# Patient Record
Sex: Female | Born: 2000
Health system: Southern US, Community
[De-identification: ages and names within clinical notes are randomized; demographics above are authoritative.]

## PROBLEM LIST (undated history)

## (undated) DIAGNOSIS — F509 Eating disorder, unspecified: Secondary | ICD-10-CM

## (undated) DIAGNOSIS — J45990 Exercise induced bronchospasm: Secondary | ICD-10-CM

## (undated) DIAGNOSIS — R109 Unspecified abdominal pain: Secondary | ICD-10-CM

## (undated) DIAGNOSIS — T7840XA Allergy, unspecified, initial encounter: Secondary | ICD-10-CM

## (undated) DIAGNOSIS — J45909 Unspecified asthma, uncomplicated: Secondary | ICD-10-CM

## (undated) DIAGNOSIS — R1110 Vomiting, unspecified: Secondary | ICD-10-CM

## (undated) HISTORY — DX: Unspecified abdominal pain: R10.9

## (undated) HISTORY — DX: Vomiting, unspecified: R11.10

## (undated) HISTORY — PX: DISTAL PATELLAR REALIGNMENT: SHX1469

## (undated) HISTORY — DX: Unspecified asthma, uncomplicated: J45.909

## (undated) HISTORY — PX: ADENOIDECTOMY: SHX5191

## (undated) HISTORY — DX: Allergy, unspecified, initial encounter: T78.40XA

---
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
## 2020-01-08 DIAGNOSIS — S50311A Abrasion of right elbow, initial encounter: Secondary | ICD-10-CM | POA: Diagnosis not present

## 2020-02-26 DIAGNOSIS — M25551 Pain in right hip: Secondary | ICD-10-CM | POA: Diagnosis not present

## 2020-02-26 DIAGNOSIS — S76011A Strain of muscle, fascia and tendon of right hip, initial encounter: Secondary | ICD-10-CM | POA: Diagnosis not present

## 2020-03-22 DIAGNOSIS — R269 Unspecified abnormalities of gait and mobility: Secondary | ICD-10-CM | POA: Diagnosis not present

## 2020-03-22 DIAGNOSIS — M25551 Pain in right hip: Secondary | ICD-10-CM | POA: Diagnosis not present

## 2020-03-22 DIAGNOSIS — M25552 Pain in left hip: Secondary | ICD-10-CM | POA: Diagnosis not present

## 2020-03-29 ENCOUNTER — Telehealth: Payer: 59

## 2020-03-29 DIAGNOSIS — M25551 Pain in right hip: Secondary | ICD-10-CM | POA: Diagnosis not present

## 2020-03-29 DIAGNOSIS — S76011D Strain of muscle, fascia and tendon of right hip, subsequent encounter: Secondary | ICD-10-CM | POA: Diagnosis not present

## 2020-04-06 ENCOUNTER — Encounter: Payer: Self-pay | Admitting: Family

## 2020-04-06 ENCOUNTER — Telehealth (INDEPENDENT_AMBULATORY_CARE_PROVIDER_SITE_OTHER): Payer: Self-pay | Admitting: Family

## 2020-04-06 DIAGNOSIS — F4323 Adjustment disorder with mixed anxiety and depressed mood: Secondary | ICD-10-CM

## 2020-04-06 MED ORDER — ESCITALOPRAM OXALATE 10 MG PO TABS: 10.0000 mg | ORAL_TABLET | Freq: Every day | ORAL | 0 refills | Status: DC

## 2020-04-06 MED FILL — ESCITALOPRAM 10 MG TABLET: 10 | 30 days supply | Qty: 30 | Fill #0

## 2020-04-06 NOTE — Progress Notes (Signed)
This note is not being shared with the patient for the following reason: To respect privacy (The patient or proxy has requested that the information not be shared).   THIS RECORD MAY CONTAIN CONFIDENTIAL INFORMATION THAT SHOULD NOT BE RELEASED WITHOUT REVIEW OF THE SERVICE PROVIDER.  Virtual Follow-Up Visit via Video Note  I connected with Bridget [name]   on 04/06/20 at 10:30 AM EDT by a video enabled telemedicine application and verified that I am speaking with the correct person using two identifiers.   [place]    I discussed the limitations of evaluation and management by telemedicine and the availability of in person appointments.  I discussed that the purpose of this telehealth visit is to provide medical care while limiting exposure to the novel coronavirus.  The patient expressed understanding and agreed to proceed.   Bridget [name] is a 19 y.o. female referred by Marcelina Morel, MD here today for follow-up of adjustment disorder with mixed anxiety and depressed mood.   History was provided by the patient.  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  HENT: Negative for sore throat.   Eyes: Negative for blurred vision and pain.  Cardiovascular: Negative for chest pain and palpitations.  Gastrointestinal: Negative for abdominal pain.  Musculoskeletal: Negative for myalgias.  Skin: Negative for rash.  Neurological: Negative for dizziness and headaches.  Psychiatric/Behavioral: Negative for depression. The patient is nervous/anxious.      Allergies  Allergen Reactions  . Food Anaphylaxis    Tree nuts  . Peanut-Containing Drug Products Anaphylaxis  . Amoxicillin Hives  . Cephalosporins Other (See Comments)    omnicef--erthyema multiforme - unknown allergic reaction per mom  . Dust Mite Extract   . Lactose Intolerance (Gi) Other (See Comments)    Gas and bloating - reaction to whey, casein, milk products  . Milk-Related Compounds Other (See Comments)    Gas and bloating, rash   . Mold Extract [Trichophyton]   . Penicillins Hives    Has patient had a PCN reaction causing immediate rash, facial/tongue/throat swelling, SOB or lightheadedness with hypotension: Yes Has patient had a PCN reaction causing severe rash involving mucus membranes or skin necrosis: No Has patient had a PCN reaction that required hospitalization No Has patient had a PCN reaction occurring within the last 10 years: No If all of the above answers are "NO", then may proceed with Cephalosporin use.   Outpatient Medications Prior to Visit  Medication Sig Dispense Refill  . Albuterol Sulfate (PROAIR RESPICLICK) 947 (90 Base) MCG/ACT AEPB Inhale 2 puffs into the lungs  See admin instructions. Reported on 02/28/2016    . budesonide (PULMICORT) 180 MCG/ACT inhaler Use 2 puffs once daily to prevent cough or wheeze. 1 Inhaler 4  . EPINEPHrine (EPIPEN JR 2-PAK) 0.15 MG/0.3ML injection Inject 0.15 mg into the muscle once as needed  for anaphylaxis (nut allergies). Reported on 03/29/2016    . EPINEPHrine 0.3 mg/0.3 mL IJ SOAJ injection Inject 0.3 mg into the muscle once.    Marland Kitchen FLUoxetine (PROZAC) 20 MG capsule TAKE 1 CAPSULE (20 MG TOTAL) BY MOUTH DAILY. TAKE WITH 40MG  CAPSULE FOR TOTAL OF 60 MG 90 capsule 1  . FLUoxetine (PROZAC) 40 MG capsule TAKE 1 CAPSULE (40 MG TOTAL) BY MOUTH DAILY. TAKE WITH 20MG  CAPSULE FOR TOTAL OF 60MG  90 capsule 1  . ibuprofen (ADVIL,MOTRIN) 200 MG tablet Take 200 mg by mouth daily as needed (pain).    Marland Kitchen loratadine (CLARITIN REDITABS) 10 MG dissolvable tablet Take 10 mg by mouth daily.    . montelukast (SINGULAIR) 10 MG tablet Take one tablet each evening to prevent cough or wheeze. 30 tablet 5  . Pediatric Multiple Vit-C-FA (MULTIVITAMIN ANIMAL SHAPES, WITH CA/FA,) with C & FA chewable tablet Chew 1 tablet by mouth daily.    . Probiotic Product (PROBIOTIC PO) Take by mouth.     No facility-administered medications prior to visit.     Patient Active Problem List   Diagnosis Date Noted  . Status post laparoscopic cholecystectomy 05/13/2018  . Adjustment disorder with mixed anxiety and depressed mood 03/27/2016  . Disordered eating 03/27/2016  . 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 Exercise-induced asthma 04/26/2015  . 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 Osteochondral lesion 12/23/2014   Visual Observations/Objective:   General Appearance: Well nourished well developed, in no apparent distress.  Eyes: conjunctiva no swelling or erythema ENT/Mouth: No hoarseness, No cough for duration of visit.  Neck: Supple  Respiratory: Respiratory effort normal, normal rate, no retractions or distress.   Cardio: Appears well-perfused, noncyanotic Musculoskeletal: no obvious deformity Skin: visible skin without rashes, ecchymosis, erythema Neuro: Awake and oriented X 3,  Psych:  normal affect, Insight and Judgment appropriate.    Assessment/Plan: 1. Adjustment disorder  with mixed anxiety and depressed mood -reviewed SSRI and role in anxiety -she would like to try new medication; discussed lexapro 10 mg  -will send to local pharmacy  -sent My Chart message to initiate set up -she will send message if she wants to start medication this week while at beach  -will send referal for therapist: Effie Shy    I discussed the assessment and treatment plan with the patient and/or parent/guardian.  They were provided an opportunity to ask questions and all were answered.  They agreed with the plan and demonstrated an understanding of the instructions. They were advised to call back or seek an in-person evaluation in the emergency room if the symptoms worsen or if the condition fails to improve as anticipated.   Follow-up:   2 weeks   Medical decision-making:   I spent 45 minutes on this telehealth visit inclusive of face-to-face video and care coordination time I was located remote during this encounter.   Parthenia Ames, NP    CC: Marcelina Morel, MD, Marcelina Morel, MD

## 2020-04-14 DIAGNOSIS — J3089 Other allergic rhinitis: Secondary | ICD-10-CM | POA: Diagnosis not present

## 2020-04-14 DIAGNOSIS — Z91010 Allergy to peanuts: Secondary | ICD-10-CM | POA: Diagnosis not present

## 2020-04-14 DIAGNOSIS — L2089 Other atopic dermatitis: Secondary | ICD-10-CM | POA: Diagnosis not present

## 2020-04-14 DIAGNOSIS — J301 Allergic rhinitis due to pollen: Secondary | ICD-10-CM | POA: Diagnosis not present

## 2020-04-14 MED FILL — SYMBICORT 80-4.5 MCG INH: 80-4.5 | 15 days supply | Qty: 10 | Fill #0

## 2020-04-19 DIAGNOSIS — R269 Unspecified abnormalities of gait and mobility: Secondary | ICD-10-CM | POA: Diagnosis not present

## 2020-04-19 DIAGNOSIS — M25552 Pain in left hip: Secondary | ICD-10-CM | POA: Diagnosis not present

## 2020-04-19 DIAGNOSIS — M25551 Pain in right hip: Secondary | ICD-10-CM | POA: Diagnosis not present

## 2020-04-20 ENCOUNTER — Encounter: Payer: Self-pay | Admitting: Family

## 2020-04-20 ENCOUNTER — Telehealth: Payer: Self-pay | Admitting: Family

## 2020-04-20 ENCOUNTER — Telehealth: Payer: Self-pay

## 2020-04-20 DIAGNOSIS — Z113 Encounter for screening for infections with a predominantly sexual mode of transmission: Secondary | ICD-10-CM | POA: Diagnosis not present

## 2020-04-20 DIAGNOSIS — F4323 Adjustment disorder with mixed anxiety and depressed mood: Secondary | ICD-10-CM

## 2020-04-20 DIAGNOSIS — Z01419 Encounter for gynecological examination (general) (routine) without abnormal findings: Secondary | ICD-10-CM | POA: Diagnosis not present

## 2020-04-20 MED FILL — ETONOGESTREL-ETHINYL ESTRAD: 0.12-0.015 | 84 days supply | Qty: 3 | Fill #0

## 2020-04-20 NOTE — Telephone Encounter (Signed)
Pt started Lexapro on 7/3. So far doing well. No adverse effects. Rescheduled appointment for next week for a virtual visit.

## 2020-04-20 NOTE — Progress Notes (Signed)
Patient not seen. Will call for reschedule.

## 2020-04-26 DIAGNOSIS — M25552 Pain in left hip: Secondary | ICD-10-CM | POA: Diagnosis not present

## 2020-04-26 DIAGNOSIS — M25551 Pain in right hip: Secondary | ICD-10-CM | POA: Diagnosis not present

## 2020-04-26 DIAGNOSIS — R269 Unspecified abnormalities of gait and mobility: Secondary | ICD-10-CM | POA: Diagnosis not present

## 2020-04-27 ENCOUNTER — Telehealth (INDEPENDENT_AMBULATORY_CARE_PROVIDER_SITE_OTHER): Payer: Self-pay | Admitting: Family

## 2020-04-27 DIAGNOSIS — Z7182 Exercise counseling: Secondary | ICD-10-CM | POA: Diagnosis not present

## 2020-04-27 DIAGNOSIS — Z111 Encounter for screening for respiratory tuberculosis: Secondary | ICD-10-CM | POA: Diagnosis not present

## 2020-04-27 DIAGNOSIS — Z113 Encounter for screening for infections with a predominantly sexual mode of transmission: Secondary | ICD-10-CM | POA: Diagnosis not present

## 2020-04-27 DIAGNOSIS — Z0000 Encounter for general adult medical examination without abnormal findings: Secondary | ICD-10-CM | POA: Diagnosis not present

## 2020-04-27 DIAGNOSIS — Z68.41 Body mass index (BMI) pediatric, 5th percentile to less than 85th percentile for age: Secondary | ICD-10-CM | POA: Diagnosis not present

## 2020-04-27 DIAGNOSIS — F4323 Adjustment disorder with mixed anxiety and depressed mood: Secondary | ICD-10-CM

## 2020-04-27 DIAGNOSIS — Z713 Dietary counseling and surveillance: Secondary | ICD-10-CM | POA: Diagnosis not present

## 2020-04-27 DIAGNOSIS — Z00129 Encounter for routine child health examination without abnormal findings: Secondary | ICD-10-CM | POA: Diagnosis not present

## 2020-04-27 MED ORDER — ESCITALOPRAM OXALATE 10 MG PO TABS: 10.0000 mg | ORAL_TABLET | Freq: Every day | ORAL | 0 refills | Status: DC

## 2020-04-27 NOTE — Progress Notes (Signed)
This note is not being shared with the patient for the following reason: To respect privacy (The patient or proxy has requested that the information not be shared).  THIS RECORD MAY CONTAIN CONFIDENTIAL INFORMATION THAT SHOULD NOT BE RELEASED WITHOUT REVIEW OF THE SERVICE PROVIDER.  Virtual Follow-Up Visit via Video Note  I connected with Bridget [name] 's patient  on 04/27/20 at 10:00 AM EDT by a video enabled telemedicine application and verified that I am speaking with the correct person using two identifiers.   Patient/parent location: home   I discussed the limitations of evaluation and management by telemedicine and the availability of in person appointments.  I discussed that the purpose of this telehealth visit is to provide medical care while limiting exposure to the novel coronavirus.  The patient expressed understanding and agreed to proceed.   Bridget [name] is a 19 y.o. female referred by Marcelina Morel, MD here today for follow-up of adjustment disorder with mixed anxiety and depressed mood   History was provided by the patient.  Plan from Last Visit:   -start lexapro 10 mg  -referral for therapy with Effie Shy   Chief Complaint: -Adjustment disorder with mixed anxiety and depressed mood  History of Present Illness:  -been feeling really good  -first day was really good, definitely has seen improvement overall  -parents have commented that she seems happier -anxiety is lower -takes it at night 7-8 pm, with food on her stomach to avoid acid reflux  -in the morning, has been slightly nauseous in the morning  -sleep has been pretty good   Review of Systems  Constitutional: Negative for malaise/fatigue and weight loss.  HENT: Negative for sore throat.   Eyes: Negative for blurred vision and pain.  Respiratory: Negative for shortness of breath.   Cardiovascular: Negative for chest pain and palpitations.  Gastrointestinal: Negative for abdominal pain and nausea.   Genitourinary: Negative for dysuria.  Musculoskeletal: Negative for joint pain and myalgias.  Skin: Negative for itching and rash.  Neurological: Negative for dizziness and headaches.  Psychiatric/Behavioral: Negative for depression and suicidal ideas. The patient is not nervous/anxious.      Allergies  Allergen Reactions  . Food Anaphylaxis    Tree nuts  . Peanut-Containing Drug Products Anaphylaxis  . Amoxicillin Hives  . Cephalosporins Other (See Comments)    omnicef--erthyema multiforme - unknown allergic reaction per mom  . Dust Mite Extract   . Lactose Intolerance (Gi) Other (See Comments)    Gas and bloating - reaction to whey, casein, milk products  . Milk-Related Compounds Other (See Comments)    Gas and bloating, rash   . Mold Extract [Trichophyton]   . Penicillins Hives    Has patient had a PCN reaction causing immediate rash, facial/tongue/throat swelling, SOB or lightheadedness with hypotension: Yes Has patient had a PCN reaction causing severe rash involving mucus membranes or skin necrosis: No Has patient had a PCN reaction that required hospitalization No Has patient had a PCN reaction occurring within the last 10 years: No If all of the above answers are "NO", then may proceed with Cephalosporin use.   Outpatient Medications Prior to Visit  Medication Sig Dispense Refill  . Albuterol Sulfate (PROAIR RESPICLICK) 564 (90 Base) MCG/ACT AEPB Inhale 2 puffs into the lungs See admin instructions. Reported on 02/28/2016    . budesonide (PULMICORT) 180 MCG/ACT inhaler Use 2 puffs once daily to prevent cough or wheeze. 1 Inhaler 4  . EPINEPHrine (EPIPEN JR 2-PAK) 0.15 MG/0.3ML injection  Inject 0.15 mg into the muscle once as needed for anaphylaxis (nut allergies). Reported on 03/29/2016    . EPINEPHrine 0.3 mg/0.3 mL IJ SOAJ injection Inject 0.3 mg into the muscle once.    . escitalopram (LEXAPRO) 10 MG tablet Take 1 tablet (10 mg total) by mouth daily. 30 tablet 0  .  ibuprofen (ADVIL,MOTRIN) 200 MG tablet Take 200 mg by mouth daily as needed (pain).    Marland Kitchen loratadine (CLARITIN REDITABS) 10 MG dissolvable tablet Take 10 mg by mouth daily.    . montelukast (SINGULAIR) 10 MG tablet Take one tablet each evening to prevent cough or wheeze. 30 tablet 5  . Pediatric Multiple Vit-C-FA (MULTIVITAMIN ANIMAL SHAPES, WITH CA/FA,) with C & FA chewable tablet Chew 1 tablet by mouth daily.    . Probiotic Product (PROBIOTIC PO) Take by mouth.     No facility-administered medications prior to visit.     Patient Active Problem List   Diagnosis Date Noted  . Status post laparoscopic cholecystectomy 05/13/2018  . Adjustment disorder with mixed anxiety and depressed mood 03/27/2016  . Disordered eating 03/27/2016  . 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 Exercise-induced asthma 04/26/2015  . 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 Osteochondral lesion 12/23/2014   The following portions of the patient's history were reviewed and updated as appropriate: allergies, current medications, past family history, past medical history, past social history, past surgical history and problem list.  Visual Observations/Objective:   General Appearance: Well nourished well developed, in no apparent distress.  Eyes: conjunctiva no swelling or erythema ENT/Mouth: No hoarseness, No cough for duration of visit.  Neck: Supple  Respiratory: Respiratory effort normal, normal rate, no retractions or distress.   Cardio: Appears well-perfused, noncyanotic Musculoskeletal: no obvious deformity Skin: visible skin without rashes, ecchymosis, erythema Neuro: Awake and oriented X 3,  Psych:  normal affect, Insight and Judgment appropriate.    Assessment/Plan: 1. Adjustment disorder with mixed anxiety and depressed mood  -improvement noted with onset  -continue to monitor symptoms  -Effie Shy for sports psychology therapy  -[place] screenings: PHQSADS at next VV  I  discussed the assessment and treatment plan with the patient and/or parent/guardian.  They were provided an opportunity to ask questions and all were answered.  They agreed with the plan and demonstrated an understanding of the instructions. They were advised to call back or seek an in-person evaluation in the emergency room if the symptoms worsen or if the condition fails to improve as anticipated.   Follow-up: 4 weeks   Medical decision-making:   I spent 15 minutes on this telehealth visit inclusive of face-to-face video and care coordination time I was located remote during this encounter.   Parthenia Ames, NP    CC: Marcelina Morel, MD, Marcelina Morel, MD

## 2020-04-28 ENCOUNTER — Encounter: Payer: Self-pay | Admitting: Family

## 2020-04-30 MED FILL — ESCITALOPRAM 10 MG TABLET: 10 | 90 days supply | Qty: 90 | Fill #0

## 2020-05-03 DIAGNOSIS — Z111 Encounter for screening for respiratory tuberculosis: Secondary | ICD-10-CM | POA: Diagnosis not present

## 2020-05-03 DIAGNOSIS — R269 Unspecified abnormalities of gait and mobility: Secondary | ICD-10-CM | POA: Diagnosis not present

## 2020-05-03 DIAGNOSIS — M25551 Pain in right hip: Secondary | ICD-10-CM | POA: Diagnosis not present

## 2020-05-03 DIAGNOSIS — M25552 Pain in left hip: Secondary | ICD-10-CM | POA: Diagnosis not present

## 2020-05-10 DIAGNOSIS — M25552 Pain in left hip: Secondary | ICD-10-CM | POA: Diagnosis not present

## 2020-05-10 DIAGNOSIS — M25551 Pain in right hip: Secondary | ICD-10-CM | POA: Diagnosis not present

## 2020-05-10 DIAGNOSIS — R269 Unspecified abnormalities of gait and mobility: Secondary | ICD-10-CM | POA: Diagnosis not present

## 2020-05-20 ENCOUNTER — Other Ambulatory Visit: Payer: Self-pay | Admitting: Family

## 2020-05-26 DIAGNOSIS — Z0000 Encounter for general adult medical examination without abnormal findings: Secondary | ICD-10-CM | POA: Diagnosis not present

## 2020-06-01 ENCOUNTER — Telehealth: Payer: Self-pay | Admitting: Family

## 2020-06-29 DIAGNOSIS — H00021 Hordeolum internum right upper eyelid: Secondary | ICD-10-CM | POA: Diagnosis not present

## 2020-08-20 DIAGNOSIS — Z23 Encounter for immunization: Secondary | ICD-10-CM | POA: Diagnosis not present

## 2020-08-20 DIAGNOSIS — S91342A Puncture wound with foreign body, left foot, initial encounter: Secondary | ICD-10-CM | POA: Diagnosis not present

## 2020-08-22 DIAGNOSIS — J039 Acute tonsillitis, unspecified: Secondary | ICD-10-CM | POA: Diagnosis not present

## 2020-08-22 DIAGNOSIS — B009 Herpesviral infection, unspecified: Secondary | ICD-10-CM | POA: Diagnosis not present

## 2020-10-06 ENCOUNTER — Encounter: Payer: Self-pay | Admitting: Family Medicine

## 2020-10-06 ENCOUNTER — Other Ambulatory Visit: Payer: Self-pay | Admitting: Family Medicine

## 2020-10-06 ENCOUNTER — Ambulatory Visit: Payer: 59 | Admitting: Family Medicine

## 2020-10-06 VITALS — BP 116/70 | HR 72 | Ht 60.0 in | Wt 120.8 lb

## 2020-10-06 DIAGNOSIS — J45990 Exercise induced bronchospasm: Secondary | ICD-10-CM | POA: Diagnosis not present

## 2020-10-06 DIAGNOSIS — J309 Allergic rhinitis, unspecified: Secondary | ICD-10-CM | POA: Diagnosis not present

## 2020-10-06 DIAGNOSIS — N926 Irregular menstruation, unspecified: Secondary | ICD-10-CM | POA: Diagnosis not present

## 2020-10-06 DIAGNOSIS — F4323 Adjustment disorder with mixed anxiety and depressed mood: Secondary | ICD-10-CM | POA: Diagnosis not present

## 2020-10-06 DIAGNOSIS — E559 Vitamin D deficiency, unspecified: Secondary | ICD-10-CM

## 2020-10-06 DIAGNOSIS — R5383 Other fatigue: Secondary | ICD-10-CM

## 2020-10-06 MED ORDER — BUDESONIDE 180 MCG/ACT IN AEPB: INHALATION_SPRAY | RESPIRATORY_TRACT | 0 refills | Status: DC

## 2020-10-06 MED ORDER — MONTELUKAST SODIUM 10 MG PO TABS: ORAL_TABLET | ORAL | 0 refills | Status: DC

## 2020-10-06 MED FILL — MONTELUKAST SOD 10 MG TAB: 10 | 30 days supply | Qty: 30 | Fill #0

## 2020-10-06 NOTE — Patient Instructions (Addendum)
Restart the montelukast, pulmicort and flonase. Continue the zyrtec. You have evidence of allergies (nasal, which is likely draining into the throat/chest, which is contributing to some asthma).  Drink plenty of water (remember very pale/clear urine is the goal). Be sure to adequately fuel your workouts.  You likely have a little bit of reflux/GERD. Use prilosec OTC once daily for up to 2 weeks or Pepcid 20mg  twice daily.  This will treat reflux, should help with any stomach pain, and also with the nausea.

## 2020-10-06 NOTE — Progress Notes (Signed)
Chief Complaint  Patient presents with  . Establish Care    Establish care. Also has not felt well in over a month. Works out over [employer] a day. Very fatigued, dizzy at times. Things that used to never tire her out now do. Has body aches and some hot flashes, sweats. Had gallbladder out 2 years ago.   Patient presents to establish care, with complaints of fatigue, dizziness, nausea, hot flashes and muscle aches.  She is a Museum/gallery exhibitions officer at [employer], and is on the triathlon team. She reports she had intense training up through November. Season is over now. After Nationals 11/14, trained only a little. Ever since then she has had persistent/worsening nausea, feels overly fatigued.  Doesn't feel refreshed when she wakes up. But also not sleeping well. Recently she has been waking up at night with sweats..  Not sleeping as well at home as she does at school.  She has some trouble falling asleep. Also, at home she has cats, having some flare of allergies. Nausea is also worse at night. Eats dinner 7-8pm.  Bedtime 10pm. Feels nauseated as she tries to fall asleep, and wakes up with it. Denies heartburn. Does have h/o reflux when younger.  Carbonated beverages also cause discomfort.  She has some mild diffuse abdominal discomfort, and has been having some frequent stools (not loose).  She developed a dry, sore throat after Nationals in [place].  She reports she was evaluated, had negative strep and other tests.  Also treated for a cold sore. Those symptoms resolved.  She wonders if potentially she could have had mono.  She is complaining of body aches, which she reports feels different from soreness from workout.  She feels like her body isn't recovering. Woke up today with her back in a "knot".  Because of this she is having some discomfort breathing today.   Ran/biked yesterday--seemed harder than usual. Mother notes that she exercises back to back at home, while at school her training was broken up by  classes, meals.  She used to run 26 miles/week at school, and now is struggling with a 4 mile run.  Feels nauseated and dizzy when swimming, and also hits her when she stands up.  During finals week she ate some sugar before/after working out,which helped Doesn't usually use electrolyte supplements for training. Tries to eat a lot of protein, not taking protein supplements due to allergies.  Patient has a history of asthma and allergies.  She used to take pulmicort, montelukast, flonase and zyrtec.  She wasn't having any flares of asthma since starting school, so stopped taking montelukast and pulmicort.  She continues to take zyrtec daily.  She did have an asthma flare the other day--noted more chest discomfort when exercising. Feels like she can't breathe as well with exercise, didn't feel the same as wheezing, didn't use inhaler. Today having some trouble breathing, possibly related to back pain she woke up with.  Menses are very irregular. Last one was very heavy, 12/9 She saw Dr. Philis Pique over the summer, was prescribed NuvaRing, but never started it. She is in a sexual relationship, 1 partner, uses condoms.  She took a home pregnancy test last week, which was negative. COVID test negative 2 weeks ago (when she returned from school)  Lexapro started in HS, stopped in school.  This history was a little vague/confusing (patient allowed mother to stay, who hadn't been aware that she had stopped medication).  She denies any depression or anxiety.  Past Medical History:  Diagnosis Date  . Abdominal pain   . Allergy    Phreesia 04/04/2020  . Asthma    Phreesia 04/04/2020  . Eating disorder    9th-10th grade  . Exercise-induced asthma   . Vomiting    Past Surgical History:  Procedure Laterality Date  . ADENOIDECTOMY    . DISTAL PATELLAR REALIGNMENT Left   . LAPAROSCOPIC CHOLECYSTECTOMY  2019   Family History  Problem Relation Age of Onset  . Diabetes Brother   . Cancer Other    . Hypertension Other   . Asthma Other   . Depression Mother   . Depression Sister   . Anxiety disorder Sister   . Breast cancer Maternal Grandmother   . Lung cancer Paternal Grandfather        former smoker  . Hypertension Paternal Grandfather    Social History   Social History Narrative   Freshman at [employer].   Triathlete   Studying exercise science   No tobacco, alcohol, drugs, vaping. Condoms for contraception.  Meds: only taking zyrtec and probiotic currently. Outpatient Encounter Medications as of 10/06/2020  Medication Sig Note  . cetirizine (ZYRTEC) 10 MG chewable tablet Chew 10 mg by mouth daily.   . Probiotic Product (PROBIOTIC PO) Take by mouth. 10/06/2020: Drink   . Albuterol Sulfate (PROAIR RESPICLICK) 163 (90 Base) MCG/ACT AEPB Inhale 2 puffs into the lungs See admin instructions. Reported on 02/28/2016 (Patient not taking: Reported on 10/06/2020)   . budesonide (PULMICORT) 180 MCG/ACT inhaler Use 2 puffs once daily to prevent cough or wheeze. (Patient not taking: Reported on 10/06/2020)   . EPINEPHrine 0.3 mg/0.3 mL IJ SOAJ injection Inject 0.3 mg into the muscle once. (Patient not taking: Reported on 10/06/2020)   . escitalopram (LEXAPRO) 10 MG tablet TAKE 1 TABLET BY MOUTH ONCE DAILY (Patient not taking: Reported on 10/06/2020)   . ibuprofen (ADVIL,MOTRIN) 200 MG tablet Take 200 mg by mouth daily as needed (pain). (Patient not taking: Reported on 10/06/2020)   . montelukast (SINGULAIR) 10 MG tablet Take one tablet each evening to prevent cough or wheeze. (Patient not taking: Reported on 10/06/2020)   . [DISCONTINUED] EPINEPHrine (EPIPEN JR) 0.15 MG/0.3ML injection Inject 0.15 mg into the muscle once as needed for anaphylaxis (nut allergies). Reported on 03/29/2016 (Patient not taking: Reported on 10/06/2020) 8/46/6599: States duplicate.  . [DISCONTINUED] loratadine (CLARITIN REDITABS) 10 MG dissolvable tablet Take 10 mg by mouth daily.   . [DISCONTINUED] Pediatric  Multiple Vit-C-FA (MULTIVITAMIN ANIMAL SHAPES, WITH CA/FA,) with C & FA chewable tablet Chew 1 tablet by mouth daily.    No facility-administered encounter medications on file as of 10/06/2020.   Allergies  Allergen Reactions  . Food Anaphylaxis    Tree nuts  . Peanut-Containing Drug Products Anaphylaxis  . Amoxicillin Hives  . Cephalosporins Other (See Comments)    omnicef--erthyema multiforme - unknown allergic reaction per mom  . Dust Mite Extract   . Lactose Intolerance (Gi) Other (See Comments)    Gas and bloating - reaction to whey, casein, milk products  . Milk-Related Compounds Other (See Comments)    Gas and bloating, rash   . Mold Extract [Trichophyton]   . Penicillins Hives    Has patient had a PCN reaction causing immediate rash, facial/tongue/throat swelling, SOB or lightheadedness with hypotension: Yes Has patient had a PCN reaction causing severe rash involving mucus membranes or skin necrosis: No Has patient had a PCN reaction that required hospitalization No Has patient had a PCN reaction occurring within  the last 10 years: No If all of the above answers are "NO", then may proceed with Cephalosporin use.   ROS: no fever, chills, no current URI symptoms.  +dizziness, fatigue, nausea per HPI.  +back pain today. No change to hair, nails, skin. No rashes. Feels hotter than usual, some sweats at night. More frequent bowel movements. No urinary complaints.    PHYSICAL EXAM:  BP 116/70   Pulse 72   Ht 5' (1.524 m)   Wt 120 lb 12.8 oz (54.8 kg)   BMI 23.59 kg/m   Well developed female, in no distress She is alert, oriented. HEENT: conjunctiva and sclera are clear, EOMI.  Nasal mucosa is moderately edematous with clear mucus R>L. OP is clear. Sinuses nontender Neck: no lymphadenopathy, thyromegaly or mass Heart: regular rate and rhythm, no murmur Lungs: clear bilaterally, no wheezes Abdomen: mildly tender in epigastrium. Abdomen is soft, no masses.  Also  slightly tender diffusely bilaterally. No suprapubic tenderness. Back: no spinal tenderness, SI tenderness or CVA tenderness. She is tender inferomedially to R scapula, over paraspinous muscles. Extremities: no edema Neuro: alert and oriented, DTR's 2+, normal gait, strength Psych: normal mood, affect, hygiene and grooming Skin: no visible rashes, normal turgor  PF 250, 240  ASSESSMENT/PLAN:  Fatigue, unspecified type - Ddx reviewed, including related to poor sleep, allergies.  r/o other causes - Plan: Comprehensive metabolic panel, CBC with Differential/Platelet, VITAMIN D 25 Hydroxy (Vit-D Deficiency, Fractures), TSH  Irregular menses - Plan: TSH  Exercise-induced asthma - low PF, possibly exacerbated by allergies (including being home with cat), and med noncompliance. Restart inhaled steroids, singulair - Plan: budesonide (PULMICORT) 180 MCG/ACT inhaler, montelukast (SINGULAIR) 10 MG tablet  Allergic rhinitis, unspecified seasonality, unspecified trigger - cont zyrtec; restart flonase, singulair. May be contributing to her fatigue - Plan: montelukast (SINGULAIR) 10 MG tablet  Adjustment disorder with mixed anxiety and depressed mood - Stopped meds about 3 mos ago, denies recurrent depression/anxiety.  CBC, TSH, c-met, Vit D F/u 1-2 weeks, prior to going back to school.  COVID booster encouraged   Restart the montelukast, pulmicort and flonase. Continue the zyrtec. You have evidence of allergies (nasal, which is likely draining into the throat/chest, which is contributing to some asthma).  Drink plenty of water (remember very pale/clear urine is the goal). Be sure to adequately fuel your workouts.  You likely have a little bit of reflux/GERD. Use prilosec OTC once daily for up to 2 weeks or Pepcid 70m twice daily.  This will treat reflux, should help with any stomach pain, and also with the nausea.  I spent 55 minutes dedicated to the care of this patient, including pre-visit  review of records, face to face time, post-visit ordering of testing and documentation.

## 2020-10-07 ENCOUNTER — Other Ambulatory Visit: Payer: Self-pay | Admitting: Family Medicine

## 2020-10-07 LAB — COMPREHENSIVE METABOLIC PANEL
ALT: 17 IU/L (ref 0–32)
AST: 18 IU/L (ref 0–40)
Albumin/Globulin Ratio: 2.1 (ref 1.2–2.2)
Albumin: 4.7 g/dL (ref 3.9–5.0)
Alkaline Phosphatase: 61 IU/L (ref 42–106)
BUN/Creatinine Ratio: 29 — ABNORMAL HIGH (ref 9–23)
BUN: 18 mg/dL (ref 6–20)
Bilirubin Total: 1.2 mg/dL (ref 0.0–1.2)
CO2: 22 mmol/L (ref 20–29)
Calcium: 9.6 mg/dL (ref 8.7–10.2)
Chloride: 102 mmol/L (ref 96–106)
Creatinine, Ser: 0.62 mg/dL (ref 0.57–1.00)
GFR calc Af Amer: 151 mL/min/{1.73_m2} (ref >59)
GFR calc non Af Amer: 131 mL/min/{1.73_m2} (ref >59)
Globulin, Total: 2.2 g/dL (ref 1.5–4.5)
Glucose: 88 mg/dL (ref 65–99)
Potassium: 4.5 mmol/L (ref 3.5–5.2)
Sodium: 140 mmol/L (ref 134–144)
Total Protein: 6.9 g/dL (ref 6.0–8.5)

## 2020-10-07 LAB — CBC WITH DIFFERENTIAL/PLATELET
Basophils Absolute: 0 10*3/uL (ref 0.0–0.2)
Basos: 0 %
EOS (ABSOLUTE): 0.2 10*3/uL (ref 0.0–0.4)
Eos: 4 %
Hematocrit: 38.2 % (ref 34.0–46.6)
Hemoglobin: 13.2 g/dL (ref 11.1–15.9)
Immature Grans (Abs): 0 10*3/uL (ref 0.0–0.1)
Immature Granulocytes: 0 %
Lymphocytes Absolute: 2 10*3/uL (ref 0.7–3.1)
Lymphs: 35 %
MCH: 31.2 pg (ref 26.6–33.0)
MCHC: 34.6 g/dL (ref 31.5–35.7)
MCV: 90 fL (ref 79–97)
Monocytes Absolute: 0.7 10*3/uL (ref 0.1–0.9)
Monocytes: 12 %
Neutrophils Absolute: 2.8 10*3/uL (ref 1.4–7.0)
Neutrophils: 49 %
Platelets: 257 10*3/uL (ref 150–450)
RBC: 4.23 x10E6/uL (ref 3.77–5.28)
RDW: 12.5 % (ref 11.7–15.4)
WBC: 5.8 10*3/uL (ref 3.4–10.8)

## 2020-10-07 LAB — TSH: TSH: 1.19 u[IU]/mL (ref 0.450–4.500)

## 2020-10-07 LAB — VITAMIN D 25 HYDROXY (VIT D DEFICIENCY, FRACTURES): Vit D, 25-Hydroxy: 25.4 ng/mL — ABNORMAL LOW (ref 30.0–100.0)

## 2020-10-07 MED ORDER — VITAMIN D (ERGOCALCIFEROL) 1.25 MG (50000 UNIT) PO CAPS: 50000.0000 [IU] | ORAL_CAPSULE | ORAL | 0 refills | Status: DC

## 2020-10-07 MED FILL — VIT D2 1.25 MG (50,000 UNIT: 1.25 MG | 56 days supply | Qty: 8 | Fill #0

## 2020-10-07 NOTE — Addendum Note (Signed)
Addended by: Joselyn Arrow on: 10/07/2020 08:18 AM   Modules accepted: Orders

## 2020-10-13 ENCOUNTER — Telehealth: Payer: Self-pay

## 2020-10-13 NOTE — Telephone Encounter (Signed)
P.A. PULMICORT

## 2020-10-14 NOTE — Telephone Encounter (Signed)
P.A. denied, pt needs trial of Arnuity Ellipta and Flovent Diskus.  Do you want to switch?

## 2020-10-14 NOTE — Telephone Encounter (Signed)
Please contact patient and see if she has used either, if she has a preference. Arnuity would be my preference as it is just once daily.

## 2020-10-16 NOTE — Progress Notes (Deleted)
  Patient presents for follow-up.  She was seen 2 weeks ago to establish care with complaint of fatigue, dizziness, nausea, hot flashes and muscle aches.  At her visit, peak flow was noted to be low. Exam was also notable for significant nasal mucosal edema c/w allergies.  Patient has a history of asthma and allergies. She had been taking zyrtec daily, but hadn't been taking flonase, pulmicort or montelukast. She was asked to restart these medications. New prescription was sent for pulmicort, but wasn't covered--pt needs trial of Arnuity Ellipta and Flovent Diskus.   She had some epigastric discomfort, and it was felt that GERD might be contributing to her nausea.  She was advised to use prilosec OTC once daily for up to 2 weeks or Pepcid 20mg  twice daily.  UPDATE  She had labs done at last visit, got results via MyChart.  She was prescribed Vitamin D 50,000 IU weekly for low D level.  BUN/Cr ratio was elevated, otherwise labs were normal. She was encouraged to drink more water.  Vitamin D-OH 25.4  Lab Results  Component Value Date   WBC 5.8 10/06/2020   HGB 13.2 10/06/2020   HCT 38.2 10/06/2020   MCV 90 10/06/2020   PLT 257 10/06/2020     Chemistry      Component Value Date/Time   NA 140 10/06/2020 1515   K 4.5 10/06/2020 1515   CL 102 10/06/2020 1515   CO2 22 10/06/2020 1515   BUN 18 10/06/2020 1515   CREATININE 0.62 10/06/2020 1515   CREATININE 0.58 03/26/2016 1048      Component Value Date/Time   CALCIUM 9.6 10/06/2020 1515   ALKPHOS 61 10/06/2020 1515   AST 18 10/06/2020 1515   ALT 17 10/06/2020 1515   BILITOT 1.2 10/06/2020 1515     Lab Results  Component Value Date   TSH 1.190 10/06/2020    PMH, PSH, SH reviewed   PHYSICAL EXAM:  Wt Readings from Last 3 Encounters:  10/06/20 120 lb 12.8 oz (54.8 kg) (39 %, Z= -0.29)*  05/10/18 102 lb 4.7 oz (46.4 kg) (12 %, Z= -1.17)*  01/28/18 106 lb 9.6 oz (48.4 kg) (22 %, Z= -0.78)*   * Growth percentiles are based on  CDC (Girls, 2-20 Years) data.    Well developed female, in no distress She is alert, oriented. HEENT: conjunctiva and sclera are clear, EOMI.  Neck: no lymphadenopathy, thyromegaly or mass Heart: regular rate and rhythm, no murmur Lungs: clear bilaterally, no wheezes Abdomen: mildly tender in epigastrium. Abdomen is soft, no masses.  Also slightly tender diffusely bilaterally. No suprapubic tenderness. Back: no spinal tenderness, SI tenderness or CVA tenderness. She is tender inferomedially to R scapula, over paraspinous muscles. Extremities: no edema Neuro: alert and oriented, normal gait Psych: normal mood, affect, hygiene and grooming Skin: no visible rashes, normal turgor  UPDATE BACK, ABDOMEN  ASSESSMENT/PLAN:   P.A.for pulmicort denied, pt needs trial of Arnuity Ellipta and Flovent Diskus.  01/30/18 was asked to contact patient to see if she had a preference, had taken either.  No documentation that pt was called back.

## 2020-10-17 ENCOUNTER — Ambulatory Visit: Payer: 59 | Admitting: Family Medicine

## 2020-10-17 DIAGNOSIS — J309 Allergic rhinitis, unspecified: Secondary | ICD-10-CM

## 2020-10-17 DIAGNOSIS — E559 Vitamin D deficiency, unspecified: Secondary | ICD-10-CM

## 2020-10-17 NOTE — Telephone Encounter (Signed)
Left message for pt

## 2020-10-18 ENCOUNTER — Encounter: Payer: Self-pay | Admitting: Family Medicine

## 2020-10-21 ENCOUNTER — Other Ambulatory Visit: Payer: Self-pay | Admitting: Family Medicine

## 2020-10-21 MED ORDER — ARNUITY ELLIPTA 100 MCG/ACT IN AEPB: 1.0000 | INHALATION_SPRAY | Freq: Every day | RESPIRATORY_TRACT | 2 refills | Status: DC

## 2020-10-21 NOTE — Telephone Encounter (Signed)
Called pt and left another message, called mom and she states pt hasn't tried either of the preferred inhalers so Arnuity will be just fine.  Please send into pharmacy.

## 2020-10-22 MED FILL — ARNUITY ELLIPTA 100 MCG INH: 100 | 30 days supply | Qty: 30 | Fill #0

## 2020-10-25 NOTE — Telephone Encounter (Signed)
Left pt message with instructions

## 2020-10-25 NOTE — Telephone Encounter (Signed)
Patient hasn't read the message that I sent her. She no-showed her appt last week.  Please advise her of message, and if you get voicemail, leave message and ask her to check her MyChart.  Thanks.

## 2020-11-14 ENCOUNTER — Other Ambulatory Visit: Payer: Self-pay | Admitting: Family Medicine

## 2020-11-14 DIAGNOSIS — J45990 Exercise induced bronchospasm: Secondary | ICD-10-CM

## 2020-11-14 DIAGNOSIS — E559 Vitamin D deficiency, unspecified: Secondary | ICD-10-CM

## 2020-11-14 DIAGNOSIS — J309 Allergic rhinitis, unspecified: Secondary | ICD-10-CM

## 2020-11-14 MED FILL — MONTELUKAST SOD 10 MG TAB: 10 | 90 days supply | Qty: 90 | Fill #0

## 2020-11-14 NOTE — Telephone Encounter (Signed)
Spoke with patient and she said WL is okay this one time for 90 day supply, she is feeling much better and the meds are helping. She feels like an in person appt would be more beneficial as well. She will be in [facility] for a race during Spring Break in March but is going to try to find a date in the next 2 months to come in to see Dr. Lynelle Doctor in person.

## 2020-11-14 NOTE — Telephone Encounter (Signed)
Patient needs refill on this. She is away at school if appointment is needed, she states she can do a virtual.

## 2020-11-14 NOTE — Telephone Encounter (Signed)
Ok for 90d. Unsure if she wants to get it from a pharmacy near school vs at [facility]. She no-showed her visit she was supposed to have prior to returning to school. We had to change one of her meds based on insurance.  If she is feeling fine, I'd like for her to schedule a visit in the next 1-2 months.  In-person would be preferable, to recheck her PF. Maybe if she is home over spring break?  If fatigue isn't any better (the purpose of her initial visit--breathing, fatigue), then we can do a virtual visit sooner, to at least touch base about how she is doing now. If doing better, then can wait until she can home in, if possible.  (see last phone call--I sent message that she never read.)

## 2020-11-16 DIAGNOSIS — M25571 Pain in right ankle and joints of right foot: Secondary | ICD-10-CM | POA: Diagnosis not present

## 2020-11-16 DIAGNOSIS — M76811 Anterior tibial syndrome, right leg: Secondary | ICD-10-CM | POA: Diagnosis not present

## 2020-12-30 ENCOUNTER — Other Ambulatory Visit (HOSPITAL_BASED_OUTPATIENT_CLINIC_OR_DEPARTMENT_OTHER): Payer: Self-pay

## 2021-03-17 ENCOUNTER — Other Ambulatory Visit (HOSPITAL_COMMUNITY): Payer: Self-pay

## 2021-03-17 DIAGNOSIS — N920 Excessive and frequent menstruation with regular cycle: Secondary | ICD-10-CM | POA: Diagnosis not present

## 2021-03-17 MED ORDER — TARINA 24 FE 1-20 MG-MCG(24) PO TABS
ORAL_TABLET | ORAL | 4 refills | Status: AC
  Filled 2021-03-17: qty 112, 90d supply, fill #0

## 2021-03-20 ENCOUNTER — Other Ambulatory Visit (HOSPITAL_COMMUNITY): Payer: Self-pay

## 2021-03-20 ENCOUNTER — Other Ambulatory Visit: Payer: Self-pay | Admitting: Family Medicine

## 2021-03-20 DIAGNOSIS — J309 Allergic rhinitis, unspecified: Secondary | ICD-10-CM

## 2021-03-20 DIAGNOSIS — J45990 Exercise induced bronchospasm: Secondary | ICD-10-CM

## 2021-03-20 MED ORDER — MONTELUKAST SODIUM 10 MG PO TABS
ORAL_TABLET | ORAL | 0 refills | Status: AC
  Filled 2021-03-20: qty 90, 90d supply, fill #0

## 2021-03-22 ENCOUNTER — Other Ambulatory Visit (HOSPITAL_COMMUNITY): Payer: Self-pay

## 2021-03-23 ENCOUNTER — Other Ambulatory Visit (HOSPITAL_COMMUNITY): Payer: Self-pay

## 2021-04-02 NOTE — Progress Notes (Signed)
Chief Complaint  Patient presents with   Annual Exam    Nonfasting annual exam, no concerns. Needs form filled out for school. Does plan to see GYN again. Does not appear to have ever had but one HPV vaccine, willing to have the rest of the series.    Bridget [name] is a 20 y.o. female who presents for a complete physical.   She has the following concerns:  She is leaving for training camp in [place] next week. She has a list of immunizations recommended, which she read and we verified that she had everything she needed.  She was see in 09/2020 to establish care.  At that time, she had complaints of fatigue--wasn't feeling refreshed in the morning, wasn't sleeping well.  She was also having a lot of nausea, which was worse at night.  She didn't have heartburn, but had some mild diffuse abdominal discomfort, frequent stools, and carbonated beverages caused discomfort. She had been struggling with her exercise (on triathlon team at school). She was having some dizziness at that time as well, with standing.  Lab evaluation at that time revealed vitamin D low at 25.4.  She was prescribed 8 weeks of weekly rx, and advised to start a daily supplement of 1000 IU. She reports today that she took it just for a short time, not currently taking any vitamin D supplements. CBC, TSH, c-met were all normal (other then elevated BUN/Cr ratio, pt advised to stay better hydrated).  Patient has a history of asthma and allergies.  She was not using any meds for asthma prior to her last visit, just taking zyrtec daily.  She was restarted on montelukast and inhaled steroids (fluticasone). She reports that she did get the steroid inhaler, but has not used it yet.  She didn't return for follow-up appointment, but phone message stated she was feeling much better.  Menses are very irregular. She saw Dr. Philis Pique in 04/2020, was prescribed NuvaRing, but never started it. She saw Dr. Philis Pique again last week. Periods were  irregular, and heavy. She just started taking continous OCP's.  Denies any side effects. She is currently on her period.  She is in no longer in a sexual relationship, 1 lifetime partner, uses condoms.     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  At her last visit she denied anxiety or depression, and had reported not taking the lexapro. Never restarted it (refill not approved, since no longer going there), patient didn't feel she needed further medication, denies any anxiety or depression.   Immunization History  Administered Date(s) Administered   DTaP 11/27/2001, 01/19/2002, 03/23/2002, 12/29/2002, 09/26/2005   Hepatitis A 09/26/2005, 10/03/2006   Hepatitis A, Ped/Adol-2 Dose 09/26/2005, 10/03/2006   Hepatitis B 2001/08/09, 03/23/2002, 06/26/2002   Hepatitis B, ped/adol Dec 05, 2000, 03/23/2002, 06/26/2002   HiB (PRP-OMP) 01/19/2002, 03/23/2002, 06/27/2002, 12/29/2002   HiB (PRP-T) 11/27/2001, 01/19/2002, 03/23/2002, 12/29/2002   Hpv-Unspecified 07/02/2019   IPV 11/27/2001, 01/19/2002, 06/26/2002, 09/26/2005   Influenza Split 10/13/2002, 08/09/2003, 06/22/2004   Influenza,inj,Quad PF,6+ Mos 09/18/2014   Influenza-Unspecified 10/13/2002, 08/09/2003, 06/22/2004, 09/18/2014, 02/03/2016   MMR 12/29/2002, 09/26/2005   MMRV 09/26/2005   Meningococcal B, OMV 05/28/2019, 07/02/2019   Meningococcal Conjugate 04/07/2013, 04/07/2013, 05/28/2019   PFIZER(Purple Top)SARS-COV-2 Vaccination 01/15/2020, 02/09/2020, 10/16/2020   Pneumococcal Conjugate-13 11/27/2001, 01/19/2002, 03/23/2002, 10/13/2003, 10/13/2003   Td 08/20/2020   Tdap 04/07/2013  Varicella 10/13/2002, 09/26/2005   Last Pap smear: never GYN visit 03/2021 Sexually active in past, not currently # of lifetime partners-- 1 Condom use  Alcohol: rare, when at school, never more than 1 drink No  tobacco use No recreational drug use Rising sophomore at [employer]: well rounded, including milk (non-dairy).  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 Social Media: Clinton Gallant, Snapchat; all acounts are Private  Dentist: twice yearly Ophtho: yearly, wears contacts Exercise: very active, on Triathlon team. Works out 3 times daily (swim, bike, run).    PMH, PSH, SH reviewed  Outpatient Encounter Medications as of 04/03/2021  Medication Sig Note   cetirizine (ZYRTEC) 10 MG chewable tablet Chew 10 mg by mouth daily.    montelukast (SINGULAIR) 10 MG tablet TAKE 1 TABLET BY MOUTH EACH EVENING TO PREVENT COUGH OR WHEEZE    Norethindrone Acetate-Ethinyl Estrad-FE (TARINA 24 FE) 1-20 MG-MCG(24) tablet Take 1 tablet by mouth once daily. Take continuosly skipping placebo tablets    Probiotic Product (PROBIOTIC PO) Take by mouth.    Albuterol Sulfate (PROAIR RESPICLICK) 376 (90 Base) MCG/ACT AEPB Inhale 2 puffs into the lungs See admin instructions. Reported on 02/28/2016 (Patient not taking: No sig reported) 04/03/2021: Uses prn, only rarely needs   EPINEPHrine 0.3 mg/0.3 mL IJ SOAJ injection Inject 0.3 mg into the muscle once. (Patient not taking: No sig reported)    Fluticasone Furoate 100 MCG/ACT AEPB INHALE 1 PUFF INTO THE LUNGS DAILY. (Patient not taking: Reported on 04/03/2021)    ibuprofen (ADVIL,MOTRIN) 200 MG tablet Take 200 mg by mouth daily as needed (pain). (Patient not taking: No sig reported)    [DISCONTINUED] escitalopram (LEXAPRO) 10 MG tablet TAKE 1 TABLET BY MOUTH ONCE DAILY (Patient not taking: Reported on 10/06/2020) 10/06/2020: Stopped in September   [DISCONTINUED] Vitamin D, Ergocalciferol, (DRISDOL) 1.25 MG (50000 UNIT) CAPS capsule TAKE 1 CAPSULE BY MOUTH EVERY 7 DAYS    No facility-administered encounter medications on file as of 04/03/2021.   Allergies  Allergen Reactions   Food Anaphylaxis    Tree nuts    Peanut-Containing Drug Products Anaphylaxis   Amoxicillin Hives   Cephalosporins Other (See Comments)    omnicef--erthyema multiforme - unknown allergic reaction per mom   Dust Mite Extract    Lactose Intolerance (Gi) Other (See Comments)    Gas and bloating - reaction to whey, casein, milk products   Milk-Related Compounds Other (See Comments)    Gas and bloating, rash    Mold Extract [Trichophyton]    Penicillins Hives    Has patient had a PCN reaction causing immediate rash, facial/tongue/throat swelling, SOB or lightheadedness with hypotension: Yes Has patient had a PCN reaction causing severe rash involving mucus membranes or skin necrosis: No Has patient had a PCN reaction that required hospitalization No Has patient had a PCN reaction occurring within the last 10 years: No If all of the above answers are "NO", then may proceed with Cephalosporin use.   ROS: No n/v/d/heartburn, constipation, diarrhea, urinary complaints. No vaginal discharge, odor, itch. No dizziness, syncope Headaches with weather changes, no nausea, photo- or phonophobia. Breathing is good currently Allergies are controlled Moods are good.   PHYSICAL EXAM:  BP 100/60   Pulse 64   Ht 5' (1.524 m)   Wt 124 lb (56.2 kg)   LMP 04/02/2021   BMI 24.22 kg/m   Wt Readings from Last 3 Encounters:  04/03/21 124 lb (56.2 kg) (43 %, Z= -0.18)*  10/06/20 120 lb 12.8 oz (54.8 kg) (39 %, Z= -0.29)*  05/10/18 102 lb 4.7 oz (46.4 kg) (12 %, Z= -1.17)*   * Growth percentiles are based on CDC (Girls, 2-20 Years) data.   Well developed female, in no distress She is alert, oriented. HEENT: conjunctiva and sclera are clear, EOMI, fundi benign. TM's and EAC's normal. Nasal mucosa is mildly edematous. OP is clear. Sinuses nontender Neck: no lymphadenopathy, thyromegaly or mass, no carotid bruit. Heart: regular rate and rhythm, no murmur Lungs: clear bilaterally, no wheezes, good air movement. Breasts: no skin  dimpling, nipple discharge.  There is a tender mas noted at 11 o'clock position of left breast (on cycle now). No other masses, no axillary lymphadenopathy. Abdomen: soft, nontender, no masses or organomegaly. Back: no spinal tenderness, SI tenderness or CVA tenderness.  Extremities: no edema, 2+ pulses. FROM of all joints Neuro: alert and oriented, DTR's 2+, normal gait, strength Psych: normal mood, affect, hygiene and grooming Skin: no visible rashes, normal turgor   PF 350/330   ASSESSMENT/PLAN:  Annual physical exam - Plan: POCT Urinalysis DIP (Proadvantage Device), Visual acuity screening, VITAMIN D 25 Hydroxy (Vit-D Deficiency, Fractures), HIV Antibody (routine testing w rflx), RPR, Hepatitis C antibody, GC/Chlamydia Probe Amp  Vitamin D deficiency - Plan: VITAMIN D 25 Hydroxy (Vit-D Deficiency, Fractures)  Exercise-induced asthma  Screen for STD (sexually transmitted disease) - Plan: HIV Antibody (routine testing w rflx), RPR, Hepatitis C antibody, GC/Chlamydia Probe Amp  Need for HPV vaccination - Plan: HPV 9-valent vaccine,Recombinat  Suspected L breast cyst. Advised to monitor this, and to f/u for re-evaluation if not resolving in 1 month.  Advised that her peak flows today were improved, still below goal. Encouraged her to try the steroid inhaler daily; reminded to rinse mouth after using.  Only 1 HPV noted in [facility]. Pt agrees to completing the series.  Risks/SE reviewed. HPV vaccine today and #3 in 4-6 months  F/u 1 year.  Sooner prn if more trouble with breathing, fatigue, any recurrent mood disorders/changes, and any persistent lump in L breast (to f/u 1 month).  FFO for school/sports.

## 2021-04-02 NOTE — Patient Instructions (Addendum)
You may want to start the steroid inhaler (fluticasone) once daily if/when your allergies flare and you feel your breathing tighter.  I suspect this will be in the spring and/or fall.  If an albuterol puff helps you, then you probably should start the preventative medication.  It takes at least a week to see the full effect. Be sure to rinse your mouth out after using it. Continue to use the albuterol just if/when needed for wheezing or shortness of breath.  We are checking your vitamin D level again today.  If it is low again, we will send in another prescription for you. If it only borderline (or normal), then I will suggest that you take an over-the-counter Vitamin D3 every day, longterm (dose depending on your value).  Be sure to recheck your left breast and ensure that the tender lump that we felt (at 11 o'clock from my looking at you) resolves--it is likely related to your cycle/hormones.  Recheck in 1 month. If it doesn't resolve, it should be evaluated with ultrasound.   Well Child Safety, Young Adult This sheet provides general safety recommendations. Talk with a health careprovider if you have any questions. Home safety Make sure your home or apartment has smoke detectors and carbon monoxide detectors. Test them once a month. Change their batteries every year. If you keep guns and ammunition in the home, make sure they are stored separately and locked away. Make your home a tobacco-free and drug-free environment. Motor vehicle safety  Wear a seat belt whenever you drive or ride in a vehicle. Do not text, talk, or use your phone or other mobile devices while driving. Do not drive when you are tired. If you feel like you may fall asleep while driving, pull over at a safe location and take a break or switch drivers. Do not drive after drinking or using drugs. Plan for a designated driver or another way to go home. Do not ride in a car with someone who has been using drugs or alcohol. Do  not ride in the bed or cargo area of a pickup truck. Sun safety  Use broad-spectrum sunscreen that protects against UVA and UVB radiation (SPF 15 or higher). Put on sunscreen 15-30 minutes before going outside. Reapply sunscreen every 2 hours, or more often if you get wet or if you are sweating. Use enough sunscreen to cover all exposed areas. Rub it in well. Wear sunglasses when you are out in the sun. Do not use tanning beds. Tanning beds are just as harmful for your skin as the sun. Water safety Never swim alone. Only swim in designated areas. Do not swim in areas where you do not know the water conditions or where underwater hazards are located. Personal safety Do not use tobacco, drugs, anabolic steroids, or diet pills. Do not drink or use drugs while swimming, boating, riding a bike or motorcycle, or using heavy machinery. Do not drink heavily (binge drink). Your brain is still developing, and alcohol can affect your brain development. Wear protective gear for sports and other physical activities, such as a helmet, mouth guard, eye protection, wrist guards, elbow pads, and knee pads. Wear a helmet when biking, riding a motorcycle or all-terrain vehicle (ATV), skateboarding, skiing, or snowboarding. If you are sexually active, practice safe sex. Use a condom or other form of birth control (contraception) in order to prevent pregnancy and STIs (sexually transmitted infections). If you do not wish to become pregnant, use a form of birth control.  If you plan to become pregnant, see your health care provider for a preconception visit. Avoid risky situations or situations where you do not feel safe. Call for help if you find yourself in an unsafe situation. Neverleave a party or event alone without telling a friend that you are leaving. Never leave with a stranger. Neveraccept a drink from a stranger if you do not know where the drink came from. Do not misuse medicines. This means that you  should not take a medicine other than how it is prescribed and you should not take someone else's medicine. Learn to manage conflict without using violence. Avoid people who suggest unsafe or harmful behavior, and avoid unhealthy romantic relationships or friendships where you do not feel respected. No one has the right to pressure you into any activity that makes you feel uncomfortable. If others make you feel unsafe, you can: Ask for help from your parents or guardians, your health care provider, or other trusted adults like a Runner, broadcasting/film/video, coach, or counselor. Call the [employer] Violence Hotline at (670)856-1835 or go online: www.thehotline.org General instructions Protect your hearing and avoid exposure to loud music or noises by: Wearing ear protection when you are in a noisy environment (while using loud machinery, like a lawn mower, or at concerts). Making sure that the volume is not too loud when listening to music in the car or through headphones. Avoid tattoos and body piercings. Tattoos and body piercings can get infected. Where to find more information: American Academy of Pediatrics: www.healthychildren.org Centers for Disease Control and Prevention: FootballExhibition.com.br Summary Protect yourself from sun exposure by using broad-spectrum sunscreen that protects against UVA and UVB radiation (SPF 15 or higher). Wear appropriate protective gear when playing sports and doing other activities. Gear may include a helmet, mouth guard, eye protection, wrist guards, and elbow and knee pads. Be safe when driving or riding in vehicles. While driving: Wear a seat belt. Do not use your mobile device. Do not drink or use drugs. Always be aware of your surroundings. Avoid risky situations or places where you feel unsafe. Avoid relationships or friendships in which you do not feel respected. It is okay to ask for help from your parents or guardians, your health care provider, or other trusted adults like a  Runner, broadcasting/film/video, coach, or counselor. This information is not intended to replace advice given to you by your health care provider. Make sure you discuss any questions you have with your healthcare provider. Document Revised: 09/09/2020 Document Reviewed: 09/09/2020 Elsevier Patient Education  2022 [employer].

## 2021-04-03 ENCOUNTER — Ambulatory Visit (INDEPENDENT_AMBULATORY_CARE_PROVIDER_SITE_OTHER): Payer: 59 | Admitting: Family Medicine

## 2021-04-03 ENCOUNTER — Other Ambulatory Visit: Payer: Self-pay

## 2021-04-03 ENCOUNTER — Encounter: Payer: Self-pay | Admitting: Family Medicine

## 2021-04-03 VITALS — BP 100/60 | HR 64 | Ht 60.0 in | Wt 124.0 lb

## 2021-04-03 DIAGNOSIS — Z113 Encounter for screening for infections with a predominantly sexual mode of transmission: Secondary | ICD-10-CM

## 2021-04-03 DIAGNOSIS — E559 Vitamin D deficiency, unspecified: Secondary | ICD-10-CM | POA: Diagnosis not present

## 2021-04-03 DIAGNOSIS — J45990 Exercise induced bronchospasm: Secondary | ICD-10-CM

## 2021-04-03 DIAGNOSIS — Z23 Encounter for immunization: Secondary | ICD-10-CM

## 2021-04-03 DIAGNOSIS — Z0000 Encounter for general adult medical examination without abnormal findings: Secondary | ICD-10-CM

## 2021-04-03 LAB — POCT URINALYSIS DIP (PROADVANTAGE DEVICE)
Bilirubin, UA: NEGATIVE
Blood, UA: NEGATIVE
Glucose, UA: NEGATIVE mg/dL
Ketones, POC UA: NEGATIVE mg/dL
Leukocytes, UA: NEGATIVE
Nitrite, UA: NEGATIVE
Protein Ur, POC: NEGATIVE mg/dL
Specific Gravity, Urine: 1.03
Urobilinogen, Ur: NEGATIVE
pH, UA: 6 (ref 5.0–8.0)

## 2021-04-04 LAB — HIV ANTIBODY (ROUTINE TESTING W REFLEX): HIV Screen 4th Generation wRfx: NONREACTIVE

## 2021-04-04 LAB — VITAMIN D 25 HYDROXY (VIT D DEFICIENCY, FRACTURES): Vit D, 25-Hydroxy: 31.1 ng/mL (ref 30.0–100.0)

## 2021-04-04 LAB — GC/CHLAMYDIA PROBE AMP
Chlamydia trachomatis, NAA: NEGATIVE
Neisseria Gonorrhoeae by PCR: NEGATIVE

## 2021-04-04 LAB — RPR: RPR Ser Ql: NONREACTIVE

## 2021-04-04 LAB — HEPATITIS C ANTIBODY: Hep C Virus Ab: <0.1 s/co ratio (ref 0.0–0.9)

## 2021-04-11 DIAGNOSIS — Z20822 Contact with and (suspected) exposure to covid-19: Secondary | ICD-10-CM | POA: Diagnosis not present

## 2021-04-11 DIAGNOSIS — Z03818 Encounter for observation for suspected exposure to other biological agents ruled out: Secondary | ICD-10-CM | POA: Diagnosis not present

## 2021-06-06 DIAGNOSIS — J029 Acute pharyngitis, unspecified: Secondary | ICD-10-CM | POA: Diagnosis not present

## 2021-06-06 DIAGNOSIS — J069 Acute upper respiratory infection, unspecified: Secondary | ICD-10-CM | POA: Diagnosis not present

## 2021-07-04 ENCOUNTER — Other Ambulatory Visit (HOSPITAL_COMMUNITY): Payer: Self-pay

## 2021-07-04 DIAGNOSIS — M79671 Pain in right foot: Secondary | ICD-10-CM | POA: Diagnosis not present

## 2021-07-04 DIAGNOSIS — S93491A Sprain of other ligament of right ankle, initial encounter: Secondary | ICD-10-CM | POA: Diagnosis not present

## 2021-07-04 DIAGNOSIS — M25571 Pain in right ankle and joints of right foot: Secondary | ICD-10-CM | POA: Diagnosis not present

## 2021-08-02 DIAGNOSIS — R0981 Nasal congestion: Secondary | ICD-10-CM | POA: Diagnosis not present

## 2021-08-02 DIAGNOSIS — Z1330 Encounter for screening examination for mental health and behavioral disorders, unspecified: Secondary | ICD-10-CM | POA: Diagnosis not present

## 2021-08-02 DIAGNOSIS — J111 Influenza due to unidentified influenza virus with other respiratory manifestations: Secondary | ICD-10-CM | POA: Diagnosis not present

## 2021-08-02 DIAGNOSIS — R051 Acute cough: Secondary | ICD-10-CM | POA: Diagnosis not present

## 2021-08-16 DIAGNOSIS — J069 Acute upper respiratory infection, unspecified: Secondary | ICD-10-CM | POA: Diagnosis not present

## 2021-08-16 DIAGNOSIS — J45901 Unspecified asthma with (acute) exacerbation: Secondary | ICD-10-CM | POA: Diagnosis not present

## 2021-08-16 DIAGNOSIS — R051 Acute cough: Secondary | ICD-10-CM | POA: Diagnosis not present

## 2021-08-16 DIAGNOSIS — R0602 Shortness of breath: Secondary | ICD-10-CM | POA: Diagnosis not present

## 2021-09-26 DIAGNOSIS — H5213 Myopia, bilateral: Secondary | ICD-10-CM | POA: Diagnosis not present

## 2021-09-26 DIAGNOSIS — H52223 Regular astigmatism, bilateral: Secondary | ICD-10-CM | POA: Diagnosis not present

## 2021-11-06 ENCOUNTER — Other Ambulatory Visit (HOSPITAL_COMMUNITY): Payer: Self-pay

## 2021-11-06 DIAGNOSIS — M25561 Pain in right knee: Secondary | ICD-10-CM | POA: Diagnosis not present

## 2021-11-06 MED ORDER — PREDNISONE 10 MG PO TABS
ORAL_TABLET | ORAL | 0 refills | Status: AC
  Filled 2021-11-06: qty 20, 8d supply, fill #0

## 2021-11-08 DIAGNOSIS — U071 COVID-19: Secondary | ICD-10-CM | POA: Diagnosis not present

## 2021-11-08 DIAGNOSIS — Z833 Family history of diabetes mellitus: Secondary | ICD-10-CM | POA: Diagnosis not present

## 2021-11-13 ENCOUNTER — Other Ambulatory Visit (HOSPITAL_COMMUNITY): Payer: Self-pay

## 2021-11-13 MED ORDER — CARESTART COVID-19 HOME TEST VI KIT
PACK | 0 refills | Status: AC
  Filled 2021-11-13: qty 4, 4d supply, fill #0

## 2021-12-24 DIAGNOSIS — R4020 Unspecified coma: Secondary | ICD-10-CM | POA: Diagnosis not present

## 2021-12-24 DIAGNOSIS — F10120 Alcohol abuse with intoxication, uncomplicated: Secondary | ICD-10-CM | POA: Diagnosis not present

## 2021-12-24 DIAGNOSIS — R4182 Altered mental status, unspecified: Secondary | ICD-10-CM | POA: Diagnosis not present

## 2021-12-24 DIAGNOSIS — R55 Syncope and collapse: Secondary | ICD-10-CM | POA: Diagnosis not present

## 2021-12-24 DIAGNOSIS — R404 Transient alteration of awareness: Secondary | ICD-10-CM | POA: Diagnosis not present

## 2021-12-24 DIAGNOSIS — R402431 Glasgow coma scale score 3-8, in the field [EMT or ambulance]: Secondary | ICD-10-CM | POA: Diagnosis not present

## 2021-12-31 DIAGNOSIS — M25561 Pain in right knee: Secondary | ICD-10-CM | POA: Diagnosis not present

## 2021-12-31 DIAGNOSIS — M25861 Other specified joint disorders, right knee: Secondary | ICD-10-CM | POA: Diagnosis not present

## 2022-01-12 ENCOUNTER — Other Ambulatory Visit (HOSPITAL_COMMUNITY): Payer: Self-pay

## 2022-01-12 DIAGNOSIS — M25561 Pain in right knee: Secondary | ICD-10-CM | POA: Diagnosis not present

## 2022-01-30 ENCOUNTER — Other Ambulatory Visit (HOSPITAL_COMMUNITY): Payer: Self-pay

## 2022-02-02 DIAGNOSIS — M6751 Plica syndrome, right knee: Secondary | ICD-10-CM | POA: Diagnosis not present

## 2022-02-02 DIAGNOSIS — M222X1 Patellofemoral disorders, right knee: Secondary | ICD-10-CM | POA: Diagnosis not present

## 2022-04-11 ENCOUNTER — Encounter: Payer: 59 | Admitting: Family Medicine

## 2022-06-13 ENCOUNTER — Encounter: Payer: Self-pay | Admitting: Internal Medicine

## 2022-07-12 DIAGNOSIS — F411 Generalized anxiety disorder: Secondary | ICD-10-CM | POA: Diagnosis not present

## 2022-07-12 DIAGNOSIS — F322 Major depressive disorder, single episode, severe without psychotic features: Secondary | ICD-10-CM | POA: Diagnosis not present

## 2022-07-17 ENCOUNTER — Encounter: Payer: Self-pay | Admitting: Internal Medicine

## 2022-08-21 ENCOUNTER — Other Ambulatory Visit (HOSPITAL_COMMUNITY): Payer: Self-pay

## 2022-08-22 ENCOUNTER — Other Ambulatory Visit (HOSPITAL_COMMUNITY): Payer: Self-pay

## 2022-08-23 ENCOUNTER — Other Ambulatory Visit (HOSPITAL_COMMUNITY): Payer: Self-pay

## 2022-08-23 MED ORDER — ESCITALOPRAM OXALATE 10 MG PO TABS
10.0000 mg | ORAL_TABLET | Freq: Every day | ORAL | 1 refills | Status: AC
  Filled 2022-08-23: qty 30, 30d supply, fill #0

## 2022-08-27 ENCOUNTER — Other Ambulatory Visit (HOSPITAL_COMMUNITY): Payer: Self-pay

## 2022-09-10 DIAGNOSIS — F322 Major depressive disorder, single episode, severe without psychotic features: Secondary | ICD-10-CM | POA: Diagnosis not present

## 2022-09-10 DIAGNOSIS — F411 Generalized anxiety disorder: Secondary | ICD-10-CM | POA: Diagnosis not present

## 2022-09-19 DIAGNOSIS — F324 Major depressive disorder, single episode, in partial remission: Secondary | ICD-10-CM | POA: Diagnosis not present

## 2022-09-19 DIAGNOSIS — F411 Generalized anxiety disorder: Secondary | ICD-10-CM | POA: Diagnosis not present

## 2022-09-19 DIAGNOSIS — Z0000 Encounter for general adult medical examination without abnormal findings: Secondary | ICD-10-CM | POA: Diagnosis not present

## 2022-09-20 ENCOUNTER — Other Ambulatory Visit (HOSPITAL_COMMUNITY): Payer: Self-pay

## 2022-09-20 MED ORDER — ESCITALOPRAM OXALATE 10 MG PO TABS
10.0000 mg | ORAL_TABLET | Freq: Every day | ORAL | 1 refills | Status: AC
  Filled 2022-09-20 (×2): qty 90, 90d supply, fill #0
  Filled 2023-02-13: qty 90, 90d supply, fill #1

## 2022-09-20 MED ORDER — ESCITALOPRAM OXALATE 10 MG PO TABS
10.0000 mg | ORAL_TABLET | Freq: Every day | ORAL | 3 refills | Status: AC
  Filled 2022-09-20: qty 90, 90d supply, fill #0

## 2022-10-04 DIAGNOSIS — J069 Acute upper respiratory infection, unspecified: Secondary | ICD-10-CM | POA: Diagnosis not present

## 2022-10-04 DIAGNOSIS — Z6823 Body mass index (BMI) 23.0-23.9, adult: Secondary | ICD-10-CM | POA: Diagnosis not present

## 2022-10-04 DIAGNOSIS — Z03818 Encounter for observation for suspected exposure to other biological agents ruled out: Secondary | ICD-10-CM | POA: Diagnosis not present

## 2022-10-04 DIAGNOSIS — Z789 Other specified health status: Secondary | ICD-10-CM | POA: Diagnosis not present

## 2022-12-09 DIAGNOSIS — R102 Pelvic and perineal pain: Secondary | ICD-10-CM | POA: Diagnosis not present

## 2022-12-09 DIAGNOSIS — R109 Unspecified abdominal pain: Secondary | ICD-10-CM | POA: Diagnosis not present

## 2022-12-09 DIAGNOSIS — Z880 Allergy status to penicillin: Secondary | ICD-10-CM | POA: Diagnosis not present

## 2022-12-09 DIAGNOSIS — M7989 Other specified soft tissue disorders: Secondary | ICD-10-CM | POA: Diagnosis not present

## 2022-12-09 DIAGNOSIS — I959 Hypotension, unspecified: Secondary | ICD-10-CM | POA: Diagnosis not present

## 2022-12-09 DIAGNOSIS — Z9049 Acquired absence of other specified parts of digestive tract: Secondary | ICD-10-CM | POA: Diagnosis not present

## 2022-12-09 DIAGNOSIS — R200 Anesthesia of skin: Secondary | ICD-10-CM | POA: Diagnosis not present

## 2022-12-09 DIAGNOSIS — R1031 Right lower quadrant pain: Secondary | ICD-10-CM | POA: Diagnosis not present

## 2022-12-09 DIAGNOSIS — G959 Disease of spinal cord, unspecified: Secondary | ICD-10-CM | POA: Diagnosis not present

## 2022-12-09 DIAGNOSIS — R110 Nausea: Secondary | ICD-10-CM | POA: Diagnosis not present

## 2022-12-09 DIAGNOSIS — E860 Dehydration: Secondary | ICD-10-CM | POA: Diagnosis not present

## 2022-12-09 DIAGNOSIS — R29898 Other symptoms and signs involving the musculoskeletal system: Secondary | ICD-10-CM | POA: Diagnosis not present

## 2022-12-09 DIAGNOSIS — R531 Weakness: Secondary | ICD-10-CM | POA: Diagnosis not present

## 2022-12-09 DIAGNOSIS — R52 Pain, unspecified: Secondary | ICD-10-CM | POA: Diagnosis not present

## 2022-12-12 ENCOUNTER — Telehealth: Payer: Self-pay

## 2022-12-12 NOTE — Transitions of Care (Post Inpatient/ED Visit) (Signed)
   12/12/2022  Name: Bridget [name] MRN: [id number] DOB: [birth year]  Today's TOC FU Call Status: Today's TOC FU Call Status:: Unsuccessul Call (1st Attempt) Unsuccessful Call (1st Attempt) Date: 12/12/22  Attempted to reach the patient regarding the most recent Inpatient/ED visit.  Follow Up Plan: Additional outreach attempts will be made to reach the patient to complete the Transitions of Care (Post Inpatient/ED visit) call.   [place] LPN [place] Advisor Direct Dial [phone]

## 2022-12-13 NOTE — Transitions of Care (Post Inpatient/ED Visit) (Signed)
   12/13/2022  Name: Bridget [name] MRN: [id number] DOB: [birth year]  Today's TOC FU Call Status: Today's TOC FU Call Status:: Unsuccessful Call (2nd Attempt) Unsuccessful Call (1st Attempt) Date: 12/12/22 Unsuccessful Call (2nd Attempt) Date: 12/13/22  Attempted to reach the patient regarding the most recent Inpatient/ED visit.  Follow Up Plan: Additional outreach attempts will be made to reach the patient to complete the Transitions of Care (Post Inpatient/ED visit) call.   [place] LPN [place] Advisor Direct Dial [phone]

## 2022-12-17 NOTE — Transitions of Care (Post Inpatient/ED Visit) (Signed)
   12/17/2022  Name: Bridget [name] MRN: [id number] DOB: [birth year]  Today's TOC FU Call Status: Today's TOC FU Call Status:: Unsuccessful Call (3rd Attempt) Unsuccessful Call (1st Attempt) Date: 12/12/22 Unsuccessful Call (2nd Attempt) Date: 12/13/22 Unsuccessful Call (3rd Attempt) Date: 12/17/22  Attempted to reach the patient regarding the most recent Inpatient/ED visit.  Follow Up Plan: No further outreach attempts will be made at this time. We have been unable to contact the patient.  [place] LPN [place] Advisor Direct Dial [phone]

## 2022-12-25 ENCOUNTER — Other Ambulatory Visit (HOSPITAL_COMMUNITY): Payer: Self-pay

## 2023-02-13 ENCOUNTER — Other Ambulatory Visit (HOSPITAL_COMMUNITY): Payer: Self-pay

## 2023-08-16 DIAGNOSIS — Z01419 Encounter for gynecological examination (general) (routine) without abnormal findings: Secondary | ICD-10-CM | POA: Diagnosis not present

## 2023-09-16 ENCOUNTER — Telehealth: Payer: Self-pay | Admitting: Nurse Practitioner

## 2023-09-16 DIAGNOSIS — J069 Acute upper respiratory infection, unspecified: Secondary | ICD-10-CM

## 2023-09-16 NOTE — Progress Notes (Signed)
Virtual Visit Consent   Bridget [name], you are scheduled for a virtual visit with a [facility] provider today. Just as with appointments in the office, your consent must be obtained to participate. Your consent will be active for this visit and any virtual visit you may have with one of our providers in the next 365 days. If you have a MyChart account, a copy of this consent can be sent to you electronically.  As this is a virtual visit, video technology does not allow for your provider to perform a traditional examination. This may limit your provider's ability to fully assess your condition. If your provider identifies any concerns that need to be evaluated in person or the need to arrange testing (such as labs, EKG, etc.), we will make arrangements to do so. Although advances in technology are sophisticated, we cannot ensure that it will always work on either your end or our end. If the connection with a video visit is poor, the visit may have to be switched to a telephone visit. With either a video or telephone visit, we are not always able to ensure that we have a secure connection.  By engaging in this virtual visit, you consent to the provision of healthcare and authorize for your insurance to be billed (if applicable) for the services provided during this visit. Depending on your insurance coverage, you may receive a charge related to this service.  I need to obtain your verbal consent now. Are you willing to proceed with your visit today? Bridget [name] has provided verbal consent on 22/06/2023 for a virtual visit (video or telephone). Viviano Simas, FNP  Date: 09/16/2023 5:09 PM  Virtual Visit via Video Note   I, Viviano Simas, connected with  Bridget [name]  ([id number], 09/17/2003) on 09/16/23 at  5:15 PM EST by a video-enabled telemedicine application and verified that I am speaking with the correct person using two identifiers.  Location: Patient: Virtual Visit Location Patient:  Home Provider: Virtual Visit Location Provider: Home Office   I discussed the limitations of evaluation and management by telemedicine and the availability of in person appointments. The patient expressed understanding and agreed to proceed.    History of Present Illness: Bridget [name] is a 22 y.o. 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   Allergies: NKDA   Medications: No current outpatient medications on file.  Observations/Objective: Patient is well-developed, well-nourished in no acute distress.  Resting comfortably  at home.  Head is normocephalic, atraumatic.  No labored breathing.  Speech is clear and coherent with logical content.  Patient is alert and oriented at baseline.    Assessment and Plan:  1. Viral upper respiratory tract infection  Discussed treatment of  viral illness with over the counter medications Dayquil/Nyquil Vitamin C for immune support  Assure hydration and adequate intake of protein daily   Discussed time for follow up if symptoms persist/worsen or change  Continue Flonase/nasal spray for added support  Warm salt water gargles for relief of post nasal drainage and sore throat      Follow Up Instructions: I discussed the assessment and treatment plan with the patient. The patient was provided an opportunity to ask questions and all were answered.  The patient agreed with the plan and demonstrated an understanding of the instructions.  A copy of instructions were sent to the patient via MyChart unless otherwise noted below.    The patient was advised to call back or seek an in-person evaluation if the symptoms worsen or if the condition fails to improve as anticipated.    Viviano Simas, FNP

## 2023-09-25 DIAGNOSIS — F419 Anxiety disorder, unspecified: Secondary | ICD-10-CM | POA: Diagnosis not present

## 2023-09-25 DIAGNOSIS — R102 Pelvic and perineal pain: Secondary | ICD-10-CM | POA: Diagnosis not present

## 2023-09-25 DIAGNOSIS — Z113 Encounter for screening for infections with a predominantly sexual mode of transmission: Secondary | ICD-10-CM | POA: Diagnosis not present

## 2023-09-25 DIAGNOSIS — R1030 Lower abdominal pain, unspecified: Secondary | ICD-10-CM | POA: Diagnosis not present

## 2023-09-25 DIAGNOSIS — Z30011 Encounter for initial prescription of contraceptive pills: Secondary | ICD-10-CM | POA: Diagnosis not present

## 2023-10-03 ENCOUNTER — Other Ambulatory Visit (HOSPITAL_COMMUNITY): Payer: Self-pay

## 2023-10-04 ENCOUNTER — Other Ambulatory Visit (HOSPITAL_COMMUNITY): Payer: Self-pay

## 2023-10-07 DIAGNOSIS — R140 Abdominal distension (gaseous): Secondary | ICD-10-CM | POA: Diagnosis not present

## 2023-10-07 DIAGNOSIS — R1084 Generalized abdominal pain: Secondary | ICD-10-CM | POA: Diagnosis not present

## 2023-10-07 DIAGNOSIS — K5909 Other constipation: Secondary | ICD-10-CM | POA: Diagnosis not present

## 2023-10-07 DIAGNOSIS — R10816 Epigastric abdominal tenderness: Secondary | ICD-10-CM | POA: Diagnosis not present

## 2023-10-17 DIAGNOSIS — R1084 Generalized abdominal pain: Secondary | ICD-10-CM | POA: Diagnosis not present

## 2023-10-28 ENCOUNTER — Other Ambulatory Visit (HOSPITAL_COMMUNITY): Payer: Self-pay

## 2023-10-28 ENCOUNTER — Other Ambulatory Visit: Payer: Self-pay

## 2023-10-28 MED ORDER — PLENVU 140 G PO SOLR
1.0000 | ORAL | 0 refills | Status: AC
  Filled 2023-10-28 – 2023-11-01 (×2): qty 3, 1d supply, fill #0

## 2023-10-29 ENCOUNTER — Other Ambulatory Visit: Payer: Self-pay

## 2023-10-29 ENCOUNTER — Other Ambulatory Visit (HOSPITAL_COMMUNITY): Payer: Self-pay

## 2023-10-30 ENCOUNTER — Encounter: Payer: Self-pay | Admitting: Pharmacist

## 2023-10-30 ENCOUNTER — Other Ambulatory Visit: Payer: Self-pay

## 2023-11-01 ENCOUNTER — Other Ambulatory Visit (HOSPITAL_COMMUNITY): Payer: Self-pay

## 2023-11-01 ENCOUNTER — Other Ambulatory Visit: Payer: Self-pay

## 2023-11-02 ENCOUNTER — Other Ambulatory Visit (HOSPITAL_COMMUNITY): Payer: Self-pay

## 2023-11-04 DIAGNOSIS — K6389 Other specified diseases of intestine: Secondary | ICD-10-CM | POA: Diagnosis not present

## 2023-11-04 DIAGNOSIS — R899 Unspecified abnormal finding in specimens from other organs, systems and tissues: Secondary | ICD-10-CM | POA: Diagnosis not present

## 2023-11-04 DIAGNOSIS — R198 Other specified symptoms and signs involving the digestive system and abdomen: Secondary | ICD-10-CM | POA: Diagnosis not present

## 2023-11-20 ENCOUNTER — Other Ambulatory Visit (HOSPITAL_COMMUNITY): Payer: Self-pay

## 2023-11-22 ENCOUNTER — Other Ambulatory Visit (HOSPITAL_COMMUNITY): Payer: Self-pay

## 2023-11-22 DIAGNOSIS — H16143 Punctate keratitis, bilateral: Secondary | ICD-10-CM | POA: Diagnosis not present

## 2023-11-23 ENCOUNTER — Other Ambulatory Visit (HOSPITAL_COMMUNITY): Payer: Self-pay

## 2023-11-23 MED ORDER — LINACLOTIDE 145 MCG PO CAPS
145.0000 ug | ORAL_CAPSULE | Freq: Every day | ORAL | 3 refills | Status: AC
  Filled 2023-11-23 – 2023-11-26 (×2): qty 90, 90d supply, fill #0
  Filled 2024-03-06: qty 90, 90d supply, fill #1
  Filled 2024-03-12: qty 90, 90d supply, fill #0
  Filled 2024-03-12: qty 60, 60d supply, fill #0
  Filled 2024-03-12: qty 30, 30d supply, fill #0
  Filled 2024-06-15: qty 90, 90d supply, fill #1
  Filled 2024-06-17: qty 90, 90d supply, fill #0
  Filled 2024-09-10: qty 90, 90d supply, fill #1

## 2023-11-25 ENCOUNTER — Other Ambulatory Visit (HOSPITAL_COMMUNITY): Payer: Self-pay

## 2023-11-26 ENCOUNTER — Other Ambulatory Visit (HOSPITAL_COMMUNITY): Payer: Self-pay

## 2023-11-26 ENCOUNTER — Other Ambulatory Visit: Payer: Self-pay

## 2023-11-27 ENCOUNTER — Other Ambulatory Visit: Payer: Self-pay

## 2023-11-28 DIAGNOSIS — R140 Abdominal distension (gaseous): Secondary | ICD-10-CM | POA: Diagnosis not present

## 2023-11-28 DIAGNOSIS — R10816 Epigastric abdominal tenderness: Secondary | ICD-10-CM | POA: Diagnosis not present

## 2023-11-28 DIAGNOSIS — K589 Irritable bowel syndrome without diarrhea: Secondary | ICD-10-CM | POA: Diagnosis not present

## 2023-12-10 DIAGNOSIS — R10816 Epigastric abdominal tenderness: Secondary | ICD-10-CM | POA: Diagnosis not present

## 2023-12-10 DIAGNOSIS — R140 Abdominal distension (gaseous): Secondary | ICD-10-CM | POA: Diagnosis not present

## 2023-12-10 DIAGNOSIS — K589 Irritable bowel syndrome without diarrhea: Secondary | ICD-10-CM | POA: Diagnosis not present

## 2024-03-06 ENCOUNTER — Other Ambulatory Visit: Payer: Self-pay

## 2024-03-06 ENCOUNTER — Other Ambulatory Visit (HOSPITAL_COMMUNITY): Payer: Self-pay

## 2024-03-09 ENCOUNTER — Other Ambulatory Visit (HOSPITAL_COMMUNITY): Payer: Self-pay

## 2024-03-09 ENCOUNTER — Other Ambulatory Visit: Payer: Self-pay

## 2024-03-12 ENCOUNTER — Other Ambulatory Visit (HOSPITAL_COMMUNITY): Payer: Self-pay

## 2024-03-12 ENCOUNTER — Other Ambulatory Visit: Payer: Self-pay

## 2024-03-13 ENCOUNTER — Other Ambulatory Visit: Payer: Self-pay

## 2024-03-13 ENCOUNTER — Other Ambulatory Visit (HOSPITAL_COMMUNITY): Payer: Self-pay

## 2024-04-28 ENCOUNTER — Telehealth: Payer: Self-pay

## 2024-04-28 DIAGNOSIS — J019 Acute sinusitis, unspecified: Secondary | ICD-10-CM

## 2024-04-28 DIAGNOSIS — B9789 Other viral agents as the cause of diseases classified elsewhere: Secondary | ICD-10-CM

## 2024-04-28 NOTE — Patient Instructions (Signed)
 Marticia JONELLE Sparrow, thank you for joining [name], FNP for today's virtual visit.  While this provider is not your primary care provider (PCP), if your PCP is located in our provider database this encounter information will be shared with them immediately following your visit.   A [facility] MyChart account gives you access to today's visit and all your visits, tests, and labs performed at [facility]  click here if you don't have a [facility] MyChart account or go to [email or web address]  Consent: (Patient) Tatiyana R Tedder provided verbal consent for this virtual visit at the beginning of the encounter.  Current Medications:  Current Outpatient Medications:    Albuterol Sulfate (PROAIR RESPICLICK) 108 (90 Base) MCG/ACT AEPB, Inhale 2 puffs into the lungs See admin instructions. Reported on 02/28/2016 (Patient not taking: No sig reported), Disp: , Rfl:    cetirizine (ZYRTEC) 10 MG chewable tablet, Chew 10 mg by mouth daily., Disp: , Rfl:    COVID-19 At Home Antigen Test [facility] COVID-19 HOME TEST) KIT, Use as directed, Disp: 4 each, Rfl: 0   EPINEPHrine 0.3 mg/0.3 mL IJ SOAJ injection, Inject 0.3 mg into the muscle once. (Patient not taking: No sig reported), Disp: , Rfl:    escitalopram  (LEXAPRO ) 10 MG tablet, Take 1 tablet (10 mg total) by mouth daily., Disp: 30 tablet, Rfl: 1   escitalopram  (LEXAPRO ) 10 MG tablet, Take 1 tablet (10 mg total) by mouth daily., Disp: 90 tablet, Rfl: 3   escitalopram  (LEXAPRO ) 10 MG tablet, Take 1 tablet (10 mg total) by mouth daily., Disp: 90 tablet, Rfl: 1   ibuprofen (ADVIL,MOTRIN) 200 MG tablet, Take 200 mg by mouth daily as needed (pain). (Patient not taking: No sig reported), Disp: , Rfl:    linaclotide  (LINZESS ) 145 MCG CAPS capsule, Take 1 capsule (145 mcg total) by mouth daily on an empty stomach at least 30 mins before first meal of the day., Disp: 90 capsule, Rfl: 3   montelukast  (SINGULAIR ) 10 MG tablet, TAKE 1 TABLET BY  MOUTH EACH EVENING TO PREVENT COUGH OR WHEEZE, Disp: 90 tablet, Rfl: 0   Norethindrone  Acetate-Ethinyl Estrad-FE (TARINA  24 FE) 1-20 MG-MCG(24) tablet, Take 1 tablet by mouth once daily. Take continuosly skipping placebo tablets, Disp: 112 tablet, Rfl: 4   PEG-KCl-NaCl-NaSulf-Na Asc-C (PLENVU ) 140 g SOLR, Take as directed for colonoscopy prep, Disp: 3 each, Rfl: 0   predniSONE  (DELTASONE ) 10 MG tablet, Take 4 tablets by mouth once daily for 2 days, 3 tablets daily for 2 days, 2 tablets daily for 2 days, 1 tablet daily for 2 days then stop, Disp: 20 tablet, Rfl: 0   Probiotic Product (PROBIOTIC PO), Take by mouth., Disp: , Rfl:    Medications ordered in this encounter:  No orders of the defined types were placed in this encounter.    *If you need refills on other medications prior to your next appointment, please contact your pharmacy*  Follow-Up: Call back or seek an in-person evaluation if the symptoms worsen or if the condition fails to improve as anticipated.  [facility] [phone]  Other Instructions  Increase fluid intake.  Tylenol  or Ibuprofen (if not contraindicated) may be taken as needed for fever/chills.  Guaifenesin XR(plain Mucinex) 600mg  two tablets twice a day to help with congestion.  Recommend saline spray to each nostril to help thin mucus. Warm moist compresses laying on forehead or across your nose may help relieve some pressure in sinuses.  We discussed that most illnesses within  the first week are viral in nature and antibiotics will unfortunately not prevent them from developing into a bacterial infection, but will also not help resolve symptoms quicker. For symptom management I would recommend utilizing the guaifenesin (brand name Mucinex thins mucus), Start Flonase or Nasocort (steroid nasal spray at bedtime), and saline spray as needed during the day. If symptoms have not significantly improved by Friday 05/01/2024. I would recommend submitting an  E-visit or following up with your PCP. After 7 days of persistent symptoms an antibiotic may be reconsidered.   If you have worsening symptoms I would recommend follow up sooner in person.   If you have been instructed to have an in-person evaluation today at a local Urgent Care facility, please use the link below. It will take you to a list of all of our available [facility], including address, phone number and hours of operation. Please do not delay care.  [facility]  If you or a family member do not have a primary care provider, use the link below to schedule a visit and establish care. When you choose a [facility] primary care physician or advanced practice provider, you gain a long-term partner in health. Find a Primary Care Provider  Learn more about [facility] in-office and virtual care options:  - Get Care Now

## 2024-04-28 NOTE — Progress Notes (Signed)
 Virtual Visit Consent   Bridget [name], you are scheduled for a virtual visit with a [facility] provider today. Just as with appointments in the office, your consent must be obtained to participate. Your consent will be active for this visit and any virtual visit you may have with one of our providers in the next 365 days. If you have a MyChart account, a copy of this consent can be sent to you electronically.  As this is a virtual visit, video technology does not allow for your provider to perform a traditional examination. This may limit your provider's ability to fully assess your condition. If your provider identifies any concerns that need to be evaluated in person or the need to arrange testing (such as labs, EKG, etc.), we will make arrangements to do so. Although advances in technology are sophisticated, we cannot ensure that it will always work on either your end or our end. If the connection with a video visit is poor, the visit may have to be switched to a telephone visit. With either a video or telephone visit, we are not always able to ensure that we have a secure connection.  By engaging in this virtual visit, you consent to the provision of healthcare and authorize for your insurance to be billed (if applicable) for the services provided during this visit. Depending on your insurance coverage, you may receive a charge related to this service.  I need to obtain your verbal consent now. Are you willing to proceed with your visit today? [name] has provided verbal consent on 04/28/2024 for a virtual visit (video or telephone). [name], FNP  Date: 04/28/2024 10:18 AM   Virtual Visit via Video Note   I, [name], connected with  [name]  ([id number], [birth year]) on 04/28/24 at 10:00 AM EDT by a video-enabled telemedicine application and verified that I am speaking with the correct person using two identifiers.  Location: Patient: Virtual Visit Location  Patient: Home Provider: Virtual Visit Location Provider: Home Office   I discussed the limitations of evaluation and management by telemedicine and the availability of in person appointments. The patient expressed understanding and agreed to proceed.    History of Present Illness: Bridget [name] is a 23 y.o.  female, and is being seen today for sick symptoms x 3 days. She started with feeling congested and thought it was due to traveling and allergies. Congestion has gotten thicker, she has had some sneezing. Phlegm is yellow. Day two she felt more sinus pressure and pressure in both ears. Yesterday she came back from [place] . Today she woke with a sore throat and lost her voice last night. Coughing a little but has mucus when Has not checked her temperature. No chills but felt a little sore in her back. Lots of walking and activity while she was there. Headaches were the worst. Has been taking Mucinex Cold and Flu liquid 2x a day.  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   Osteochondral lesion 12/23/2014    Allergies:  Allergies  Allergen Reactions   Food Anaphylaxis    Tree nuts  Peanut-Containing Drug Products Anaphylaxis   Amoxicillin Hives   Cephalosporins Other (See Comments)    omnicef--erthyema multiforme - unknown allergic reaction per mom   Dust Mite Extract    Lactose Intolerance (Gi) Other (See Comments)    Gas and bloating - reaction to whey, casein, milk products   Milk-Related Compounds Other (See Comments)    Gas and bloating, rash    Mold Extract [Trichophyton]     Penicillins Hives    Has patient had a PCN reaction causing immediate rash, facial/tongue/throat swelling, SOB or lightheadedness with hypotension: Yes Has patient had a PCN reaction causing severe rash involving mucus membranes or skin necrosis: No Has patient had a PCN reaction that required hospitalization No Has patient had a PCN reaction occurring within the last 10 years: No If all of the above answers are NO, then may proceed with Cephalosporin use.   Medications:  Current Outpatient Medications:    Albuterol Sulfate (PROAIR RESPICLICK) 108 (90 Base) MCG/ACT AEPB, Inhale 2 puffs into the lungs See admin instructions. Reported on 02/28/2016 (Patient not taking: No sig reported), Disp: , Rfl:    cetirizine (ZYRTEC) 10 MG chewable tablet, Chew 10 mg by mouth daily., Disp: , Rfl:    COVID-19 At Home Antigen Test [facility] COVID-19 HOME TEST) KIT, Use as directed, Disp: 4 each, Rfl: 0   EPINEPHrine 0.3 mg/0.3 mL IJ SOAJ injection, Inject 0.3 mg into the muscle once. (Patient not taking: No sig reported), Disp: , Rfl:    escitalopram  (LEXAPRO ) 10 MG tablet, Take 1 tablet (10 mg total) by mouth daily., Disp: 30 tablet, Rfl: 1   escitalopram  (LEXAPRO ) 10 MG tablet, Take 1 tablet (10 mg total) by mouth daily., Disp: 90 tablet, Rfl: 3   escitalopram  (LEXAPRO ) 10 MG tablet, Take 1 tablet (10 mg total) by mouth daily., Disp: 90 tablet, Rfl: 1   ibuprofen (ADVIL,MOTRIN) 200 MG tablet, Take 200 mg by mouth daily as needed (pain). (Patient not taking: No sig reported), Disp: , Rfl:    linaclotide  (LINZESS ) 145 MCG CAPS capsule, Take 1 capsule (145 mcg total) by mouth daily on an empty stomach at least 30 mins before first meal of the day., Disp: 90 capsule, Rfl: 3   montelukast  (SINGULAIR ) 10 MG tablet, TAKE 1 TABLET BY MOUTH EACH EVENING TO PREVENT COUGH OR WHEEZE, Disp: 90 tablet, Rfl: 0   Norethindrone  Acetate-Ethinyl Estrad-FE (TARINA  24 FE) 1-20 MG-MCG(24) tablet, Take 1 tablet by mouth once daily.  Take continuosly skipping placebo tablets, Disp: 112 tablet, Rfl: 4   PEG-KCl-NaCl-NaSulf-Na Asc-C (PLENVU ) 140 g SOLR, Take as directed for colonoscopy prep, Disp: 3 each, Rfl: 0   predniSONE  (DELTASONE ) 10 MG tablet, Take 4 tablets by mouth once daily for 2 days, 3 tablets daily for 2 days, 2 tablets daily for 2 days, 1 tablet daily for 2 days then stop, Disp: 20 tablet, Rfl: 0   Probiotic Product (PROBIOTIC PO), Take by mouth., Disp: , Rfl:   Observations/Objective: Patient is well-developed, well-nourished in no acute distress.  Resting comfortably at home.  Head is normocephalic, atraumatic.  No labored breathing. Speech is clear and coherent with logical content.  Patient is alert and oriented at baseline.   Assessment and Plan: 1. Acute viral sinusitis (Primary)   Increase fluid intake.  Tylenol  or Ibuprofen (if not contraindicated) may be taken as needed for fever/chills.  Guaifenesin XR(plain Mucinex) 600mg  two tablets twice a day to help with congestion.  Recommend saline spray to each nostril to help  thin mucus. Warm moist compresses laying on forehead or across your nose may help relieve some pressure in sinuses.  We discussed that most illnesses within the first week are viral in nature and antibiotics will unfortunately not prevent them from developing into a bacterial infection, but will also not help resolve symptoms quicker. For symptom management I would recommend utilizing the guaifenesin (brand name Mucinex thins mucus), Start Flonase or Nasocort (steroid nasal spray at bedtime), and saline spray as needed during the day. If symptoms have not significantly improved by Friday 05/01/2024. I would recommend submitting an E-visit or following up with your PCP. After 7 days of persistent symptoms an antibiotic may be reconsidered.   If you have worsening symptoms I would recommend follow up sooner in person.  Follow Up Instructions: I discussed the assessment and treatment plan  with the patient. The patient was provided an opportunity to ask questions and all were answered. The patient agreed with the plan and demonstrated an understanding of the instructions.  A copy of instructions were sent to the patient via MyChart unless otherwise noted below.   The patient was advised to call back or seek an in-person evaluation if the symptoms worsen or if the condition fails to improve as anticipated.    [name], FNP

## 2024-06-15 ENCOUNTER — Other Ambulatory Visit (HOSPITAL_COMMUNITY): Payer: Self-pay

## 2024-06-17 ENCOUNTER — Other Ambulatory Visit (HOSPITAL_COMMUNITY): Payer: Self-pay

## 2024-07-06 DIAGNOSIS — K219 Gastro-esophageal reflux disease without esophagitis: Secondary | ICD-10-CM | POA: Diagnosis not present

## 2024-07-06 DIAGNOSIS — K582 Mixed irritable bowel syndrome: Secondary | ICD-10-CM | POA: Diagnosis not present

## 2024-07-06 DIAGNOSIS — F4323 Adjustment disorder with mixed anxiety and depressed mood: Secondary | ICD-10-CM | POA: Diagnosis not present

## 2024-08-03 DIAGNOSIS — K582 Mixed irritable bowel syndrome: Secondary | ICD-10-CM | POA: Diagnosis not present

## 2024-08-03 DIAGNOSIS — K219 Gastro-esophageal reflux disease without esophagitis: Secondary | ICD-10-CM | POA: Diagnosis not present

## 2024-08-14 DIAGNOSIS — M25562 Pain in left knee: Secondary | ICD-10-CM | POA: Diagnosis not present

## 2024-08-14 DIAGNOSIS — G8929 Other chronic pain: Secondary | ICD-10-CM | POA: Diagnosis not present

## 2024-09-11 ENCOUNTER — Other Ambulatory Visit: Payer: Self-pay
